# Patient Record
Sex: Male | Born: 1940 | Race: White | Hispanic: No | Marital: Married | Smoking: Never smoker
Health system: Southern US, Community
[De-identification: ages and names within clinical notes are randomized; demographics above are authoritative.]

## PROBLEM LIST (undated history)

## (undated) DIAGNOSIS — J309 Allergic rhinitis, unspecified: Secondary | ICD-10-CM

## (undated) DIAGNOSIS — R0683 Snoring: Secondary | ICD-10-CM

## (undated) DIAGNOSIS — E119 Type 2 diabetes mellitus without complications: Secondary | ICD-10-CM

## (undated) DIAGNOSIS — Z973 Presence of spectacles and contact lenses: Secondary | ICD-10-CM

## (undated) HISTORY — PX: TONSILLECTOMY: SUR1361

## (undated) HISTORY — DX: Allergic rhinitis, unspecified: J30.9

## (undated) HISTORY — PX: INCISIONAL HERNIA REPAIR: SHX193

## (undated) HISTORY — PX: COLONOSCOPY: SHX174

---

## 2000-03-07 ENCOUNTER — Ambulatory Visit: Admission: RE | Admit: 2000-03-07 | Discharge: 2000-03-07 | Payer: Self-pay

## 2012-02-07 DIAGNOSIS — M25549 Pain in joints of unspecified hand: Secondary | ICD-10-CM | POA: Diagnosis not present

## 2012-02-07 DIAGNOSIS — Z23 Encounter for immunization: Secondary | ICD-10-CM | POA: Diagnosis not present

## 2012-05-15 DIAGNOSIS — E119 Type 2 diabetes mellitus without complications: Secondary | ICD-10-CM | POA: Diagnosis not present

## 2012-05-15 DIAGNOSIS — H52209 Unspecified astigmatism, unspecified eye: Secondary | ICD-10-CM | POA: Diagnosis not present

## 2012-05-15 DIAGNOSIS — H40019 Open angle with borderline findings, low risk, unspecified eye: Secondary | ICD-10-CM | POA: Diagnosis not present

## 2012-06-18 DIAGNOSIS — E119 Type 2 diabetes mellitus without complications: Secondary | ICD-10-CM | POA: Diagnosis not present

## 2012-06-18 DIAGNOSIS — Z79899 Other long term (current) drug therapy: Secondary | ICD-10-CM | POA: Diagnosis not present

## 2012-06-18 DIAGNOSIS — E7800 Pure hypercholesterolemia, unspecified: Secondary | ICD-10-CM | POA: Diagnosis not present

## 2012-08-20 DIAGNOSIS — Z23 Encounter for immunization: Secondary | ICD-10-CM | POA: Diagnosis not present

## 2012-09-01 DIAGNOSIS — J069 Acute upper respiratory infection, unspecified: Secondary | ICD-10-CM | POA: Diagnosis not present

## 2012-09-01 DIAGNOSIS — D485 Neoplasm of uncertain behavior of skin: Secondary | ICD-10-CM | POA: Diagnosis not present

## 2013-01-11 DIAGNOSIS — D485 Neoplasm of uncertain behavior of skin: Secondary | ICD-10-CM | POA: Diagnosis not present

## 2013-01-11 DIAGNOSIS — L0100 Impetigo, unspecified: Secondary | ICD-10-CM | POA: Diagnosis not present

## 2013-01-20 DIAGNOSIS — D492 Neoplasm of unspecified behavior of bone, soft tissue, and skin: Secondary | ICD-10-CM | POA: Diagnosis not present

## 2013-01-20 DIAGNOSIS — M19049 Primary osteoarthritis, unspecified hand: Secondary | ICD-10-CM | POA: Diagnosis not present

## 2013-01-22 ENCOUNTER — Other Ambulatory Visit: Payer: Self-pay | Admitting: Orthopedic Surgery

## 2013-02-11 ENCOUNTER — Encounter (HOSPITAL_BASED_OUTPATIENT_CLINIC_OR_DEPARTMENT_OTHER): Payer: Self-pay | Admitting: *Deleted

## 2013-02-11 NOTE — Progress Notes (Signed)
To come in for [id number]  

## 2013-02-17 ENCOUNTER — Encounter (HOSPITAL_BASED_OUTPATIENT_CLINIC_OR_DEPARTMENT_OTHER)
Admission: RE | Admit: 2013-02-17 | Discharge: 2013-02-17 | Disposition: A | Discharge status: Home / Self Care | Payer: Medicare Other | Source: Ambulatory Visit | Attending: Orthopedic Surgery | Admitting: Orthopedic Surgery

## 2013-02-17 DIAGNOSIS — M898X9 Other specified disorders of bone, unspecified site: Secondary | ICD-10-CM | POA: Diagnosis not present

## 2013-02-17 DIAGNOSIS — E119 Type 2 diabetes mellitus without complications: Secondary | ICD-10-CM | POA: Diagnosis not present

## 2013-02-17 DIAGNOSIS — M19049 Primary osteoarthritis, unspecified hand: Secondary | ICD-10-CM | POA: Diagnosis not present

## 2013-02-17 DIAGNOSIS — M259 Joint disorder, unspecified: Secondary | ICD-10-CM | POA: Diagnosis not present

## 2013-02-17 LAB — BASIC METABOLIC PANEL
Chloride: 102 mEq/L (ref 96–112)
GFR calc Af Amer: 85 mL/min — ABNORMAL LOW (ref >90)
Potassium: 4.9 mEq/L (ref 3.5–5.1)
Sodium: 138 mEq/L (ref 135–145)

## 2013-02-18 ENCOUNTER — Encounter (HOSPITAL_BASED_OUTPATIENT_CLINIC_OR_DEPARTMENT_OTHER): Payer: Self-pay | Admitting: *Deleted

## 2013-02-18 ENCOUNTER — Ambulatory Visit (HOSPITAL_BASED_OUTPATIENT_CLINIC_OR_DEPARTMENT_OTHER)
Admission: RE | Admit: 2013-02-18 | Discharge: 2013-02-18 | Disposition: A | Discharge status: Home / Self Care | Payer: Medicare Other | Source: Ambulatory Visit | Attending: Orthopedic Surgery | Admitting: Orthopedic Surgery

## 2013-02-18 ENCOUNTER — Ambulatory Visit (HOSPITAL_BASED_OUTPATIENT_CLINIC_OR_DEPARTMENT_OTHER): Payer: Medicare Other | Admitting: Certified Registered"

## 2013-02-18 ENCOUNTER — Encounter (HOSPITAL_BASED_OUTPATIENT_CLINIC_OR_DEPARTMENT_OTHER)
Admission: RE | Disposition: A | Discharge status: Home / Self Care | Payer: Self-pay | Source: Ambulatory Visit | Attending: Orthopedic Surgery

## 2013-02-18 ENCOUNTER — Encounter (HOSPITAL_BASED_OUTPATIENT_CLINIC_OR_DEPARTMENT_OTHER): Payer: Self-pay | Admitting: Certified Registered"

## 2013-02-18 DIAGNOSIS — M19049 Primary osteoarthritis, unspecified hand: Secondary | ICD-10-CM | POA: Insufficient documentation

## 2013-02-18 DIAGNOSIS — M898X9 Other specified disorders of bone, unspecified site: Secondary | ICD-10-CM | POA: Diagnosis not present

## 2013-02-18 DIAGNOSIS — E119 Type 2 diabetes mellitus without complications: Secondary | ICD-10-CM | POA: Insufficient documentation

## 2013-02-18 DIAGNOSIS — M259 Joint disorder, unspecified: Secondary | ICD-10-CM | POA: Diagnosis not present

## 2013-02-18 DIAGNOSIS — D492 Neoplasm of unspecified behavior of bone, soft tissue, and skin: Secondary | ICD-10-CM | POA: Diagnosis not present

## 2013-02-18 DIAGNOSIS — M6740 Ganglion, unspecified site: Secondary | ICD-10-CM | POA: Diagnosis not present

## 2013-02-18 HISTORY — DX: Type 2 diabetes mellitus without complications: E11.9

## 2013-02-18 HISTORY — DX: Snoring: R06.83

## 2013-02-18 HISTORY — DX: Presence of spectacles and contact lenses: Z97.3

## 2013-02-18 LAB — GLUCOSE, CAPILLARY
Glucose-Capillary: 168 mg/dL — ABNORMAL HIGH (ref 70–99)
Glucose-Capillary: 142 mg/dL — ABNORMAL HIGH (ref 70–99)

## 2013-02-18 LAB — POCT HEMOGLOBIN-HEMACUE: Hemoglobin: 15.8 g/dL (ref 13.0–17.0)

## 2013-02-18 SURGERY — EXCISION METACARPAL MASS
Anesthesia: General | Site: Finger | Laterality: Left | Wound class: Clean

## 2013-02-18 MED ORDER — FENTANYL CITRATE 0.05 MG/ML IJ SOLN
INTRAMUSCULAR | Status: DC | PRN
  Administered 2013-02-18: 100 ug via INTRAVENOUS

## 2013-02-18 MED ORDER — PROPOFOL 10 MG/ML IV BOLUS
INTRAVENOUS | Status: DC | PRN
  Administered 2013-02-18: 200 mg via INTRAVENOUS

## 2013-02-18 MED ORDER — MIDAZOLAM HCL 5 MG/5ML IJ SOLN
INTRAMUSCULAR | Status: DC | PRN
  Administered 2013-02-18: 1 mg via INTRAVENOUS

## 2013-02-18 MED ORDER — ONDANSETRON HCL 4 MG/2ML IJ SOLN
INTRAMUSCULAR | Status: DC | PRN
  Administered 2013-02-18: 4 mg via INTRAVENOUS

## 2013-02-18 MED ORDER — CHLORHEXIDINE GLUCONATE 4 % EX LIQD: 60.0000 mL | Freq: Once | CUTANEOUS | Status: DC

## 2013-02-18 MED ORDER — EPHEDRINE SULFATE 50 MG/ML IJ SOLN
INTRAMUSCULAR | Status: DC | PRN
  Administered 2013-02-18: 10 mg via INTRAVENOUS

## 2013-02-18 MED ORDER — CEFAZOLIN SODIUM-DEXTROSE 2-3 GM-% IV SOLR
2.0000 g | INTRAVENOUS | Status: AC
  Administered 2013-02-18: 2 g via INTRAVENOUS

## 2013-02-18 MED ORDER — LIDOCAINE HCL (PF) 1 % IJ SOLN
INTRAMUSCULAR | Status: DC | PRN
  Administered 2013-02-18: .5 mL

## 2013-02-18 MED ORDER — METHYLPREDNISOLONE ACETATE 40 MG/ML IJ SUSP
INTRAMUSCULAR | Status: DC | PRN
  Administered 2013-02-18: .5 mL via INTRA_ARTICULAR

## 2013-02-18 MED ORDER — BUPIVACAINE HCL (PF) 0.25 % IJ SOLN
INTRAMUSCULAR | Status: DC | PRN
  Administered 2013-02-18: 10 mL

## 2013-02-18 MED ORDER — DEXAMETHASONE SODIUM PHOSPHATE 10 MG/ML IJ SOLN: INTRAMUSCULAR | Status: DC | PRN

## 2013-02-18 MED ORDER — HYDROCODONE-ACETAMINOPHEN 5-325 MG PO TABS: ORAL_TABLET | ORAL | Status: DC

## 2013-02-18 MED ORDER — FENTANYL CITRATE 0.05 MG/ML IJ SOLN: 50.0000 ug | INTRAMUSCULAR | Status: DC | PRN

## 2013-02-18 MED ORDER — LIDOCAINE HCL (CARDIAC) 20 MG/ML IV SOLN
INTRAVENOUS | Status: DC | PRN
  Administered 2013-02-18: 80 mg via INTRAVENOUS

## 2013-02-18 MED ORDER — LACTATED RINGERS IV SOLN
INTRAVENOUS | Status: DC
Start: 2013-02-18 — End: 2013-02-18
  Administered 2013-02-18: 08:00:00 via INTRAVENOUS

## 2013-02-18 MED ORDER — DEXAMETHASONE SODIUM PHOSPHATE 10 MG/ML IJ SOLN
INTRAMUSCULAR | Status: DC | PRN
  Administered 2013-02-18: 4 mg via INTRAVENOUS

## 2013-02-18 MED ORDER — MIDAZOLAM HCL 2 MG/2ML IJ SOLN: 1.0000 mg | INTRAMUSCULAR | Status: DC | PRN

## 2013-02-18 SURGICAL SUPPLY — 56 items
APL SKNCLS STERI-STRIP NONHPOA (GAUZE/BANDAGES/DRESSINGS)
BANDAGE COBAN STERILE 2 (GAUZE/BANDAGES/DRESSINGS) IMPLANT
BANDAGE CONFORM 2  STR LF (GAUZE/BANDAGES/DRESSINGS) IMPLANT
BANDAGE ELASTIC 3 VELCRO ST LF (GAUZE/BANDAGES/DRESSINGS) IMPLANT
BANDAGE GAUZE ELAST BULKY 4 IN (GAUZE/BANDAGES/DRESSINGS) IMPLANT
BANDAGE GAUZE STRT 1 STR LF (GAUZE/BANDAGES/DRESSINGS) IMPLANT
BENZOIN TINCTURE PRP APPL 2/3 (GAUZE/BANDAGES/DRESSINGS) IMPLANT
BLADE MINI RND TIP GREEN BEAV (BLADE) ×1 IMPLANT
BLADE SURG 15 STRL LF DISP TIS (BLADE) ×2 IMPLANT
BLADE SURG 15 STRL SS (BLADE) ×4
BNDG CMPR 9X4 STRL LF SNTH (GAUZE/BANDAGES/DRESSINGS) ×1
BNDG CMPR MD 5X2 ELC HKLP STRL (GAUZE/BANDAGES/DRESSINGS)
BNDG COHESIVE 1X5 TAN STRL LF (GAUZE/BANDAGES/DRESSINGS) ×1 IMPLANT
BNDG ELASTIC 2 VLCR STRL LF (GAUZE/BANDAGES/DRESSINGS) IMPLANT
BNDG ESMARK 4X9 LF (GAUZE/BANDAGES/DRESSINGS) ×1 IMPLANT
BNDG PLASTER X FAST 3X3 WHT LF (CAST SUPPLIES) IMPLANT
BNDG PLSTR 9X3 FST ST WHT (CAST SUPPLIES)
CHLORAPREP W/TINT 26ML (MISCELLANEOUS) ×2 IMPLANT
CLOTH BEACON ORANGE TIMEOUT ST (SAFETY) ×2 IMPLANT
CORDS BIPOLAR (ELECTRODE) ×2 IMPLANT
COVER MAYO STAND STRL (DRAPES) ×2 IMPLANT
COVER TABLE BACK 60X90 (DRAPES) ×2 IMPLANT
CUFF TOURNIQUET SINGLE 18IN (TOURNIQUET CUFF) ×2 IMPLANT
DRAPE EXTREMITY T 121X128X90 (DRAPE) ×1 IMPLANT
DRAPE SURG 17X23 STRL (DRAPES) ×2 IMPLANT
GAUZE XEROFORM 1X8 LF (GAUZE/BANDAGES/DRESSINGS) ×2 IMPLANT
GLOVE BIO SURGEON STRL SZ7.5 (GLOVE) ×2 IMPLANT
GLOVE BIOGEL PI IND STRL 7.5 (GLOVE) IMPLANT
GLOVE BIOGEL PI IND STRL 8 (GLOVE) ×1 IMPLANT
GLOVE BIOGEL PI INDICATOR 7.5 (GLOVE)
GLOVE BIOGEL PI INDICATOR 8 (GLOVE) ×1
GLOVE EXAM NITRILE EXT CUFF MD (GLOVE) ×1 IMPLANT
GLOVE SURG SS PI 7.5 STRL IVOR (GLOVE) IMPLANT
GOWN BRE IMP PREV XXLGXLNG (GOWN DISPOSABLE) ×2 IMPLANT
GOWN PREVENTION PLUS XLARGE (GOWN DISPOSABLE) ×2 IMPLANT
NDL HYPO 25X1 1.5 SAFETY (NEEDLE) ×1 IMPLANT
NEEDLE HYPO 25X1 1.5 SAFETY (NEEDLE) ×4 IMPLANT
NS IRRIG 1000ML POUR BTL (IV SOLUTION) ×2 IMPLANT
PACK BASIN DAY SURGERY FS (CUSTOM PROCEDURE TRAY) ×2 IMPLANT
PAD CAST 3X4 CTTN HI CHSV (CAST SUPPLIES) IMPLANT
PAD CAST 4YDX4 CTTN HI CHSV (CAST SUPPLIES) IMPLANT
PADDING CAST ABS 4INX4YD NS (CAST SUPPLIES) ×1
PADDING CAST ABS COTTON 4X4 ST (CAST SUPPLIES) ×1 IMPLANT
PADDING CAST COTTON 3X4 STRL (CAST SUPPLIES)
PADDING CAST COTTON 4X4 STRL (CAST SUPPLIES)
SPLINT FINGER 5/8X2.25 (CAST SUPPLIES) IMPLANT
SPLINT PLASTALUME 2 1/4 (CAST SUPPLIES) ×2
SPONGE GAUZE 4X4 12PLY (GAUZE/BANDAGES/DRESSINGS) ×2 IMPLANT
STOCKINETTE 4X48 STRL (DRAPES) ×2 IMPLANT
STRIP CLOSURE SKIN 1/2X4 (GAUZE/BANDAGES/DRESSINGS) IMPLANT
SUT ETHILON 3 0 PS 1 (SUTURE) IMPLANT
SUT ETHILON 4 0 PS 2 18 (SUTURE) ×3 IMPLANT
SYR BULB 3OZ (MISCELLANEOUS) ×2 IMPLANT
SYR CONTROL 10ML LL (SYRINGE) ×2 IMPLANT
TOWEL OR 17X24 6PK STRL BLUE (TOWEL DISPOSABLE) ×4 IMPLANT
UNDERPAD 30X30 INCONTINENT (UNDERPADS AND DIAPERS) ×2 IMPLANT

## 2013-02-18 NOTE — Op Note (Signed)
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  PROCEDURE:  Right CMC joint injection and left long finger excision of mucoid cyst and debridement, DIP joint.  SURGEON:  Betha Loa, MD.  ASSISTANT:  None.  ANESTHESIA:  General.  IV FLUIDS:  Per anesthesia flow sheet.  ESTIMATED BLOOD LOSS:  Minimal.  COMPLICATIONS:  None.  SPECIMENS:  Left long finger mucoid cyst to pathology.  TOURNIQUET TIME:  23 minutes.  DISPOSITION:  Stable to PACU.  INDICATIONS:  Mr. Rosenboom is a 72 year old male who has noted a 1-year history of mass on his left long finger.  This occasionally breaks open and leaks clear fluid out from underneath the cuticle.  There was no deformity.  I discussed with Mr. Pilley the nature of the condition.  We discussed nonoperative and operative treatment options.  He wished to proceed with operative excision of mucoid cyst and debridement of the DIP joint.  He also is to have an injection of the [facility] joint for arthritic pain.  Risks, benefits, and alternatives of surgery were discussed including the risk of blood loss; infection; damage to nerves, vessels, tendons, ligaments, bone; failure of surgery; need for additional surgery; complications with wound healing; continued pain; recurrence of the mass.  He voiced understanding and wished and elected to proceed.  OPERATIVE COURSE:  After being identified preoperatively by myself, the patient and I agreed upon procedure and site of procedure.   Surgical site was marked.  The risks, benefits, and alternatives of surgery were reviewed and he wished to proceed.  Surgical consent had been signed. He was given IV Ancef as preoperative antibiotic prophylaxis.  Patient was transferred to the operative room and placed on operating table in supine position with left upper extremity on arm board.  General anesthesia was induced by anesthesiologist.  Surgical pause was performed between surgeons, anesthesia, operating staff, and all were in agreement as to the patient, procedure, and site of procedure.  The right [facility] joint was injected under sterile conditions with Betadine alcohol prep with a solution of 0.5 mL of 1% plain lidocaine and 0.5 mL of Depo-Medrol.  This was dressed with a Band-Aid.  The left upper extremity was addressed.  It was prepped and draped in normal sterile orthopedic fashion.  Surgical pause had been performed.  Tourniquet at the proximal aspect of the extremity was inflated to 250 mmHg after exsanguination of the limb with an Esmarch bandage.  A hockey-stick incision was made at the DIP joint level of the long finger.  This was carried into subcutaneous tissues by spreading technique.  The mass was identified and freed up  of surrounding soft tissue attachments.  It was removed and sent to Pathology for examination.  The DIP joint was entered.  It was debrided of osteophytes and the synovium.  The wound was copiously irrigated with sterile saline.  It was closed with 4-0 nylon in a horizontal mattress fashion.  A digital block was performed with 10 mL of 0.25% plain Marcaine to aid in postoperative analgesia. The wound was dressed with sterile Xeroform, 4 x 4, and wrapped with a Coban dressing lightly.  An AlumaFoam splint was placed and wrapped lightly with a Coban dressing.  Tourniquet was deflated at 23 minutes. Fingertips were pink with brisk capillary refill after deflation of tourniquet.  Operative drapes were  broken down.  The patient was awoken from anesthesia safely.  He was transferred back to stretcher and taken to PACU in stable condition.  I will see him back in the office in 1 week for postoperative followup.  I will give him Norco 5/325 one to two p.o. q.6 h. p.r.n. pain, dispensed #40.     Betha Loa, MD     KK/MEDQ  D:  02/18/2013  T:  02/18/2013  Job:  [id number]

## 2013-02-18 NOTE — Op Note (Signed)
[id number] 

## 2013-02-18 NOTE — Anesthesia Procedure Notes (Signed)
Procedure Name: LMA Insertion Date/Time: 02/18/2013 8:46 AM Performed by: Lance Coon Pre-anesthesia Checklist: Patient identified, Emergency Drugs available, Suction available and Patient being monitored Patient Re-evaluated:Patient Re-evaluated prior to inductionOxygen Delivery Method: Circle System Utilized Preoxygenation: Pre-oxygenation with 100% oxygen Intubation Type: IV induction Ventilation: Mask ventilation without difficulty LMA: LMA inserted LMA Size: 5.0 Number of attempts: 1 Airway Equipment and Method: bite block Placement Confirmation: positive ETCO2 Tube secured with: Tape Dental Injury: Teeth and Oropharynx as per pre-operative assessment

## 2013-02-18 NOTE — Brief Op Note (Signed)
02/18/2013  9:25 AM  PATIENT:  Aaron May  72 y.o. male  PRE-OPERATIVE DIAGNOSIS:  LEFT LONG FINGR MUCOID CYST & DIP ARTHROSIS  POST-OPERATIVE DIAGNOSIS:  Left Long Finger Mucoid Cyst and Distal Interphalangeal Arthrosis  PROCEDURE:  Procedure(s): LEFT LONG FINGER EXCISION MASS & DIP DEBRIDEMENT, Injection Right Carpometacarpal Joint (Left)  SURGEON:  Surgeon(s) and Role:    * Tami Ribas, MD - Primary  PHYSICIAN ASSISTANT:   ASSISTANTS: none   ANESTHESIA:   general  EBL:     BLOOD ADMINISTERED:none  DRAINS: none   LOCAL MEDICATIONS USED:  MARCAINE     SPECIMEN:  Source of Specimen:  left long finger mucoid cyst  DISPOSITION OF SPECIMEN:  PATHOLOGY  COUNTS:  YES  TOURNIQUET:   Total Tourniquet Time Documented: Upper Arm (Left) - 23 minutes Total: Upper Arm (Left) - 23 minutes   DICTATION: .Other Dictation: Dictation Number [id number]  PLAN OF CARE: Discharge to home after PACU  PATIENT DISPOSITION:  PACU - hemodynamically stable.

## 2013-02-18 NOTE — Transfer of Care (Signed)
Immediate Anesthesia Transfer of Care Note  Patient: Aaron May  Procedure(s) Performed: Procedure(s): LEFT LONG FINGER EXCISION MASS & DIP DEBRIDEMENT, Injection Right Carpometacarpal Joint (Left)  Patient Location: PACU  Anesthesia Type:General  Level of Consciousness: awake  Airway & Oxygen Therapy: Patient Spontanous Breathing and Patient connected to face mask oxygen  Post-op Assessment: Report given to PACU RN and Post -op Vital signs reviewed and stable  Post vital signs: Reviewed and stable  Complications: No apparent anesthesia complications

## 2013-02-18 NOTE — Addendum Note (Signed)
Addendum created 02/18/13 1018 by Lance Coon, CRNA   Modules edited: Anesthesia Blocks and Procedures

## 2013-02-18 NOTE — H&P (Signed)
Aaron May is an 72 y.o. male.   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  Has had attempts at removal with cauterization with recurrence.  Occasionally will break and leak fluid under the cuticle. Also with CMC arthritis on right side.  Past Medical History  Diagnosis Date  . Diabetes mellitus without complication   . Wears glasses   . Snores     Past Surgical History  Procedure Laterality Date  . Tonsillectomy    . Colonoscopy    . Incisional hernia repair      right and left    History reviewed. No pertinent family history. Social History:  reports that he has never smoked. He does not have any smokeless tobacco history on file. He reports that  drinks alcohol. He reports that he does not use illicit drugs.  Allergies: No Known Allergies  Medications Prior to Admission  Medication Sig Dispense Refill  . aspirin 81 MG tablet Take 81 mg by mouth daily.      . pioglitazone-metformin (ACTOPLUS MET) 15-500 MG per tablet Take 1 tablet by mouth every morning.        Results for orders placed during the hospital encounter of 02/18/13 (from the past 48 hour(s))  BASIC METABOLIC PANEL     Status: Abnormal   Collection Time    02/17/13  8:20 AM      Result Value Range   Sodium 138  135 - 145 mEq/L   Potassium 4.9  3.5 - 5.1 mEq/L   Chloride 102  96 - 112 mEq/L   CO2 31  19 - 32 mEq/L   Glucose, Bld 246 (*) 70 - 99 mg/dL   BUN 17  6 - 23 mg/dL   Creatinine, Ser 2.53  0.50 - 1.35 mg/dL   Calcium 9.4  8.4 - 66.4 mg/dL   GFR calc non Af Amer 73 (*) >90 mL/min   GFR calc Af Amer 85 (*) >90 mL/min   Comment:            The eGFR has been calculated     using the CKD EPI equation.     This calculation has not been     validated in all clinical     situations.     eGFR's persistently     <90 mL/min signify     possible Chronic Kidney Disease.  GLUCOSE, CAPILLARY     Status: Abnormal   Collection Time    02/18/13  7:34 AM      Result Value Range   Glucose-Capillary 168 (*) 70 - 99 mg/dL  POCT HEMOGLOBIN-HEMACUE     Status: None   Collection Time    02/18/13  7:35 AM      Result Value Range   Hemoglobin 15.8  13.0 - 17.0 g/dL    No results found.   A comprehensive review of systems was negative except for: Eyes: positive for contacts/glasses  Blood pressure 157/86, pulse 60, temperature 97.6 F (36.4 C), temperature source Oral, resp. rate 20, height 6\' 1"  (1.854 m), weight 83.915 kg (185 lb), SpO2 98.00%.  General appearance: alert, cooperative and appears stated age Head: Normocephalic, without obvious abnormality, atraumatic Neck: supple, symmetrical, trachea midline Resp: clear to auscultation bilaterally Cardio: regular rate and rhythm GI: non tender Extremities: intact sensation and capillary refill all digits.  +epl/fpl/io.  mass distal to dip left long finger. skin intact.  ttp right cmc with positive grind test. Pulses: 2+ and symmetric Skin: Skin color, texture, turgor normal. No rashes or lesions Neurologic: Grossly normal Incision/Wound: na  Assessment/Plan Left long finger mucoid cyst, right cmc oa.  Non operative and operative treatment options were discussed with the patient and patient wishes to proceed with operative treatment. Risks, benefits, and alternatives of surgery were discussed and the patient agrees with the plan of care. Plan excision cyst and debridement dip joint left long finger and injection right cmc joint.  Kamariah Fruchter R 02/18/2013, 8:31 AM

## 2013-02-18 NOTE — Anesthesia Preprocedure Evaluation (Signed)
Anesthesia Evaluation  Patient identified by MRN, date of birth, ID band Patient awake    Reviewed: Allergy & Precautions, H&P , NPO status , Patient's Chart, lab work & pertinent test results  Airway Mallampati: I TM Distance: >3 FB Neck ROM: Full    Dental  (+) Teeth Intact and Dental Advisory Given   Pulmonary  breath sounds clear to auscultation        Cardiovascular Rhythm:Regular Rate:Normal     Neuro/Psych    GI/Hepatic   Endo/Other  diabetes, Well Controlled, Type 2  Renal/GU      Musculoskeletal   Abdominal   Peds  Hematology   Anesthesia Other Findings   Reproductive/Obstetrics                           Anesthesia Physical Anesthesia Plan  ASA: II  Anesthesia Plan: General   Post-op Pain Management:    Induction: Intravenous  Airway Management Planned:   Additional Equipment:   Intra-op Plan:   Post-operative Plan:   Informed Consent: I have reviewed the patients History and Physical, chart, labs and discussed the procedure including the risks, benefits and alternatives for the proposed anesthesia with the patient or authorized representative who has indicated his/her understanding and acceptance.   Dental advisory given  Plan Discussed with: CRNA, Anesthesiologist and Surgeon  Anesthesia Plan Comments:         Anesthesia Quick Evaluation

## 2013-02-18 NOTE — Anesthesia Postprocedure Evaluation (Signed)
  Anesthesia Post-op Note  Patient: Aaron May  Procedure(s) Performed: Procedure(s): LEFT LONG FINGER EXCISION MASS & DIP DEBRIDEMENT, Injection Right Carpometacarpal Joint (Left)  Patient Location: PACU  Anesthesia Type:General  Level of Consciousness: awake, alert  and oriented  Airway and Oxygen Therapy: Patient Spontanous Breathing  Post-op Pain: mild  Post-op Assessment: Post-op Vital signs reviewed  Post-op Vital Signs: Reviewed  Complications: No apparent anesthesia complications

## 2013-04-15 DIAGNOSIS — H52209 Unspecified astigmatism, unspecified eye: Secondary | ICD-10-CM | POA: Diagnosis not present

## 2013-04-15 DIAGNOSIS — E119 Type 2 diabetes mellitus without complications: Secondary | ICD-10-CM | POA: Diagnosis not present

## 2013-06-17 DIAGNOSIS — E7800 Pure hypercholesterolemia, unspecified: Secondary | ICD-10-CM | POA: Diagnosis not present

## 2013-06-17 DIAGNOSIS — Z79899 Other long term (current) drug therapy: Secondary | ICD-10-CM | POA: Diagnosis not present

## 2013-06-17 DIAGNOSIS — E119 Type 2 diabetes mellitus without complications: Secondary | ICD-10-CM | POA: Diagnosis not present

## 2013-06-17 DIAGNOSIS — Z125 Encounter for screening for malignant neoplasm of prostate: Secondary | ICD-10-CM | POA: Diagnosis not present

## 2013-06-17 DIAGNOSIS — Z0000 Encounter for general adult medical examination without abnormal findings: Secondary | ICD-10-CM | POA: Diagnosis not present

## 2013-07-24 DIAGNOSIS — Z23 Encounter for immunization: Secondary | ICD-10-CM | POA: Diagnosis not present

## 2014-01-08 DIAGNOSIS — J209 Acute bronchitis, unspecified: Secondary | ICD-10-CM | POA: Diagnosis not present

## 2014-04-22 DIAGNOSIS — H52209 Unspecified astigmatism, unspecified eye: Secondary | ICD-10-CM | POA: Diagnosis not present

## 2014-04-22 DIAGNOSIS — E119 Type 2 diabetes mellitus without complications: Secondary | ICD-10-CM | POA: Diagnosis not present

## 2014-04-22 DIAGNOSIS — H2510 Age-related nuclear cataract, unspecified eye: Secondary | ICD-10-CM | POA: Diagnosis not present

## 2014-06-23 DIAGNOSIS — E119 Type 2 diabetes mellitus without complications: Secondary | ICD-10-CM | POA: Diagnosis not present

## 2014-06-23 DIAGNOSIS — Z79899 Other long term (current) drug therapy: Secondary | ICD-10-CM | POA: Diagnosis not present

## 2014-06-23 DIAGNOSIS — E7800 Pure hypercholesterolemia, unspecified: Secondary | ICD-10-CM | POA: Diagnosis not present

## 2014-06-23 DIAGNOSIS — Z125 Encounter for screening for malignant neoplasm of prostate: Secondary | ICD-10-CM | POA: Diagnosis not present

## 2014-06-30 DIAGNOSIS — IMO0001 Reserved for inherently not codable concepts without codable children: Secondary | ICD-10-CM | POA: Diagnosis not present

## 2014-06-30 DIAGNOSIS — Z0000 Encounter for general adult medical examination without abnormal findings: Secondary | ICD-10-CM | POA: Diagnosis not present

## 2014-06-30 DIAGNOSIS — M75100 Unspecified rotator cuff tear or rupture of unspecified shoulder, not specified as traumatic: Secondary | ICD-10-CM | POA: Diagnosis not present

## 2014-06-30 DIAGNOSIS — Z23 Encounter for immunization: Secondary | ICD-10-CM | POA: Diagnosis not present

## 2014-06-30 DIAGNOSIS — IMO0002 Reserved for concepts with insufficient information to code with codable children: Secondary | ICD-10-CM | POA: Diagnosis not present

## 2014-06-30 DIAGNOSIS — E7800 Pure hypercholesterolemia, unspecified: Secondary | ICD-10-CM | POA: Diagnosis not present

## 2014-07-15 DIAGNOSIS — M75100 Unspecified rotator cuff tear or rupture of unspecified shoulder, not specified as traumatic: Secondary | ICD-10-CM | POA: Diagnosis not present

## 2014-07-15 DIAGNOSIS — IMO0002 Reserved for concepts with insufficient information to code with codable children: Secondary | ICD-10-CM | POA: Diagnosis not present

## 2014-10-06 DIAGNOSIS — M25511 Pain in right shoulder: Secondary | ICD-10-CM | POA: Diagnosis not present

## 2014-10-07 DIAGNOSIS — M25511 Pain in right shoulder: Secondary | ICD-10-CM | POA: Diagnosis not present

## 2014-10-17 DIAGNOSIS — J209 Acute bronchitis, unspecified: Secondary | ICD-10-CM | POA: Diagnosis not present

## 2014-11-03 DIAGNOSIS — M19011 Primary osteoarthritis, right shoulder: Secondary | ICD-10-CM | POA: Diagnosis not present

## 2014-11-03 DIAGNOSIS — S43431D Superior glenoid labrum lesion of right shoulder, subsequent encounter: Secondary | ICD-10-CM | POA: Diagnosis not present

## 2014-11-03 DIAGNOSIS — M25511 Pain in right shoulder: Secondary | ICD-10-CM | POA: Diagnosis not present

## 2014-11-28 DIAGNOSIS — G8918 Other acute postprocedural pain: Secondary | ICD-10-CM | POA: Diagnosis not present

## 2014-11-28 DIAGNOSIS — S43431A Superior glenoid labrum lesion of right shoulder, initial encounter: Secondary | ICD-10-CM | POA: Diagnosis not present

## 2014-11-28 DIAGNOSIS — M19011 Primary osteoarthritis, right shoulder: Secondary | ICD-10-CM | POA: Diagnosis not present

## 2014-11-28 DIAGNOSIS — M24111 Other articular cartilage disorders, right shoulder: Secondary | ICD-10-CM | POA: Diagnosis not present

## 2014-11-28 DIAGNOSIS — M75111 Incomplete rotator cuff tear or rupture of right shoulder, not specified as traumatic: Secondary | ICD-10-CM | POA: Diagnosis not present

## 2014-11-28 DIAGNOSIS — M94211 Chondromalacia, right shoulder: Secondary | ICD-10-CM | POA: Diagnosis not present

## 2014-11-28 DIAGNOSIS — M66821 Spontaneous rupture of other tendons, right upper arm: Secondary | ICD-10-CM | POA: Diagnosis not present

## 2014-11-28 DIAGNOSIS — M1990 Unspecified osteoarthritis, unspecified site: Secondary | ICD-10-CM | POA: Diagnosis not present

## 2014-12-01 DIAGNOSIS — M25511 Pain in right shoulder: Secondary | ICD-10-CM | POA: Diagnosis not present

## 2014-12-06 DIAGNOSIS — M19011 Primary osteoarthritis, right shoulder: Secondary | ICD-10-CM | POA: Diagnosis not present

## 2014-12-06 DIAGNOSIS — S43431D Superior glenoid labrum lesion of right shoulder, subsequent encounter: Secondary | ICD-10-CM | POA: Diagnosis not present

## 2014-12-06 DIAGNOSIS — M25511 Pain in right shoulder: Secondary | ICD-10-CM | POA: Diagnosis not present

## 2014-12-15 DIAGNOSIS — M25511 Pain in right shoulder: Secondary | ICD-10-CM | POA: Diagnosis not present

## 2014-12-22 DIAGNOSIS — M25511 Pain in right shoulder: Secondary | ICD-10-CM | POA: Diagnosis not present

## 2015-01-03 DIAGNOSIS — M25511 Pain in right shoulder: Secondary | ICD-10-CM | POA: Diagnosis not present

## 2015-01-05 DIAGNOSIS — Z4789 Encounter for other orthopedic aftercare: Secondary | ICD-10-CM | POA: Diagnosis not present

## 2015-01-05 DIAGNOSIS — S43431D Superior glenoid labrum lesion of right shoulder, subsequent encounter: Secondary | ICD-10-CM | POA: Diagnosis not present

## 2015-01-05 DIAGNOSIS — M75121 Complete rotator cuff tear or rupture of right shoulder, not specified as traumatic: Secondary | ICD-10-CM | POA: Diagnosis not present

## 2015-01-13 DIAGNOSIS — M25511 Pain in right shoulder: Secondary | ICD-10-CM | POA: Diagnosis not present

## 2015-01-23 DIAGNOSIS — H1132 Conjunctival hemorrhage, left eye: Secondary | ICD-10-CM | POA: Diagnosis not present

## 2015-01-23 DIAGNOSIS — H5712 Ocular pain, left eye: Secondary | ICD-10-CM | POA: Diagnosis not present

## 2015-01-23 DIAGNOSIS — M25511 Pain in right shoulder: Secondary | ICD-10-CM | POA: Diagnosis not present

## 2015-01-27 DIAGNOSIS — M25511 Pain in right shoulder: Secondary | ICD-10-CM | POA: Diagnosis not present

## 2015-01-30 DIAGNOSIS — M25511 Pain in right shoulder: Secondary | ICD-10-CM | POA: Diagnosis not present

## 2015-02-02 DIAGNOSIS — S43431D Superior glenoid labrum lesion of right shoulder, subsequent encounter: Secondary | ICD-10-CM | POA: Diagnosis not present

## 2015-02-02 DIAGNOSIS — Z4789 Encounter for other orthopedic aftercare: Secondary | ICD-10-CM | POA: Diagnosis not present

## 2015-02-13 DIAGNOSIS — M25511 Pain in right shoulder: Secondary | ICD-10-CM | POA: Diagnosis not present

## 2015-02-20 DIAGNOSIS — Z79899 Other long term (current) drug therapy: Secondary | ICD-10-CM | POA: Diagnosis not present

## 2015-02-20 DIAGNOSIS — E780 Pure hypercholesterolemia: Secondary | ICD-10-CM | POA: Diagnosis not present

## 2015-02-21 DIAGNOSIS — E1169 Type 2 diabetes mellitus with other specified complication: Secondary | ICD-10-CM | POA: Diagnosis not present

## 2015-02-21 DIAGNOSIS — E1165 Type 2 diabetes mellitus with hyperglycemia: Secondary | ICD-10-CM | POA: Diagnosis not present

## 2015-02-21 DIAGNOSIS — M25511 Pain in right shoulder: Secondary | ICD-10-CM | POA: Diagnosis not present

## 2015-02-21 DIAGNOSIS — E780 Pure hypercholesterolemia: Secondary | ICD-10-CM | POA: Diagnosis not present

## 2015-02-21 DIAGNOSIS — Z1389 Encounter for screening for other disorder: Secondary | ICD-10-CM | POA: Diagnosis not present

## 2015-02-24 DIAGNOSIS — M25511 Pain in right shoulder: Secondary | ICD-10-CM | POA: Diagnosis not present

## 2015-02-28 DIAGNOSIS — M25511 Pain in right shoulder: Secondary | ICD-10-CM | POA: Diagnosis not present

## 2015-03-02 DIAGNOSIS — Z4789 Encounter for other orthopedic aftercare: Secondary | ICD-10-CM | POA: Diagnosis not present

## 2015-03-02 DIAGNOSIS — S43431D Superior glenoid labrum lesion of right shoulder, subsequent encounter: Secondary | ICD-10-CM | POA: Diagnosis not present

## 2015-03-10 DIAGNOSIS — M25511 Pain in right shoulder: Secondary | ICD-10-CM | POA: Diagnosis not present

## 2015-03-17 DIAGNOSIS — M25511 Pain in right shoulder: Secondary | ICD-10-CM | POA: Diagnosis not present

## 2015-03-24 DIAGNOSIS — E780 Pure hypercholesterolemia: Secondary | ICD-10-CM | POA: Diagnosis not present

## 2015-03-24 DIAGNOSIS — E119 Type 2 diabetes mellitus without complications: Secondary | ICD-10-CM | POA: Diagnosis not present

## 2015-03-24 DIAGNOSIS — E1165 Type 2 diabetes mellitus with hyperglycemia: Secondary | ICD-10-CM | POA: Diagnosis not present

## 2015-03-24 DIAGNOSIS — E1169 Type 2 diabetes mellitus with other specified complication: Secondary | ICD-10-CM | POA: Diagnosis not present

## 2015-03-31 DIAGNOSIS — M25511 Pain in right shoulder: Secondary | ICD-10-CM | POA: Diagnosis not present

## 2015-04-07 DIAGNOSIS — M25511 Pain in right shoulder: Secondary | ICD-10-CM | POA: Diagnosis not present

## 2015-04-13 DIAGNOSIS — S43431D Superior glenoid labrum lesion of right shoulder, subsequent encounter: Secondary | ICD-10-CM | POA: Diagnosis not present

## 2015-04-13 DIAGNOSIS — Z4789 Encounter for other orthopedic aftercare: Secondary | ICD-10-CM | POA: Diagnosis not present

## 2015-04-14 DIAGNOSIS — M25511 Pain in right shoulder: Secondary | ICD-10-CM | POA: Diagnosis not present

## 2015-04-24 DIAGNOSIS — E780 Pure hypercholesterolemia: Secondary | ICD-10-CM | POA: Diagnosis not present

## 2015-04-24 DIAGNOSIS — H52203 Unspecified astigmatism, bilateral: Secondary | ICD-10-CM | POA: Diagnosis not present

## 2015-04-24 DIAGNOSIS — E11329 Type 2 diabetes mellitus with mild nonproliferative diabetic retinopathy without macular edema: Secondary | ICD-10-CM | POA: Diagnosis not present

## 2015-04-24 DIAGNOSIS — E119 Type 2 diabetes mellitus without complications: Secondary | ICD-10-CM | POA: Diagnosis not present

## 2015-05-24 DIAGNOSIS — E119 Type 2 diabetes mellitus without complications: Secondary | ICD-10-CM | POA: Diagnosis not present

## 2015-05-24 DIAGNOSIS — E780 Pure hypercholesterolemia: Secondary | ICD-10-CM | POA: Diagnosis not present

## 2015-05-27 DIAGNOSIS — S60512A Abrasion of left hand, initial encounter: Secondary | ICD-10-CM | POA: Diagnosis not present

## 2015-06-24 DIAGNOSIS — E119 Type 2 diabetes mellitus without complications: Secondary | ICD-10-CM | POA: Diagnosis not present

## 2015-06-24 DIAGNOSIS — E782 Mixed hyperlipidemia: Secondary | ICD-10-CM | POA: Diagnosis not present

## 2015-07-11 DIAGNOSIS — E1169 Type 2 diabetes mellitus with other specified complication: Secondary | ICD-10-CM | POA: Diagnosis not present

## 2015-07-11 DIAGNOSIS — Z125 Encounter for screening for malignant neoplasm of prostate: Secondary | ICD-10-CM | POA: Diagnosis not present

## 2015-07-11 DIAGNOSIS — Z0000 Encounter for general adult medical examination without abnormal findings: Secondary | ICD-10-CM | POA: Diagnosis not present

## 2015-07-11 DIAGNOSIS — Z23 Encounter for immunization: Secondary | ICD-10-CM | POA: Diagnosis not present

## 2015-07-11 DIAGNOSIS — E780 Pure hypercholesterolemia: Secondary | ICD-10-CM | POA: Diagnosis not present

## 2015-07-11 DIAGNOSIS — E1165 Type 2 diabetes mellitus with hyperglycemia: Secondary | ICD-10-CM | POA: Diagnosis not present

## 2015-09-24 DIAGNOSIS — E785 Hyperlipidemia, unspecified: Secondary | ICD-10-CM | POA: Diagnosis not present

## 2015-09-24 DIAGNOSIS — E119 Type 2 diabetes mellitus without complications: Secondary | ICD-10-CM | POA: Diagnosis not present

## 2015-10-24 DIAGNOSIS — E119 Type 2 diabetes mellitus without complications: Secondary | ICD-10-CM | POA: Diagnosis not present

## 2015-10-24 DIAGNOSIS — E785 Hyperlipidemia, unspecified: Secondary | ICD-10-CM | POA: Diagnosis not present

## 2015-10-27 DIAGNOSIS — S93412A Sprain of calcaneofibular ligament of left ankle, initial encounter: Secondary | ICD-10-CM | POA: Diagnosis not present

## 2015-11-17 DIAGNOSIS — K5730 Diverticulosis of large intestine without perforation or abscess without bleeding: Secondary | ICD-10-CM | POA: Diagnosis not present

## 2015-11-17 DIAGNOSIS — Z1211 Encounter for screening for malignant neoplasm of colon: Secondary | ICD-10-CM | POA: Diagnosis not present

## 2015-11-29 ENCOUNTER — Ambulatory Visit (INDEPENDENT_AMBULATORY_CARE_PROVIDER_SITE_OTHER): Payer: Medicare Other | Admitting: Family Medicine

## 2015-11-29 ENCOUNTER — Encounter: Payer: Self-pay | Admitting: Family Medicine

## 2015-11-29 VITALS — BP 128/88 | HR 73 | Ht 73.0 in | Wt 176.0 lb

## 2015-11-29 DIAGNOSIS — S46812A Strain of other muscles, fascia and tendons at shoulder and upper arm level, left arm, initial encounter: Secondary | ICD-10-CM

## 2015-11-29 NOTE — Patient Instructions (Signed)
You have trapezius strain/spasms. Start physical therapy and do home exercises on days you don't go to therapy. Heat for spasms 15 minutes at a time 3-4 times a day. Tylenol or aleve as needed for pain. Follow up with me in 5-6 weeks for reevaluation.

## 2015-12-04 DIAGNOSIS — S46819A Strain of other muscles, fascia and tendons at shoulder and upper arm level, unspecified arm, initial encounter: Secondary | ICD-10-CM | POA: Insufficient documentation

## 2015-12-04 NOTE — Assessment & Plan Note (Signed)
brief MSK u/s without evidence tear or abnormalities in area of pain other the increased thickness of trapezius.  Start physical therapy and home exercise program.  Heat, tylenol/aleve as needed.  F/u in 5-6 weeks for reevaluation.

## 2015-12-04 NOTE — Progress Notes (Signed)
PCP: No primary care provider on file.  Subjective:   HPI: Patient is a 75 y.o. male here for left shoulder pain.  Patient reports having about 3 years of posterior left shoulder/neck pain. No acute injury. Has times where this is very severe. Pain level 3/10, dull. Worse with turning to left side. Tried advil. Felt more when lifting weights. No skin changes, fever, other complaints.  Past Medical History  Diagnosis Date  . Diabetes mellitus without complication ([place])   . Wears glasses   . Snores     Current Outpatient Prescriptions on File Prior to Visit  Medication Sig Dispense Refill  . aspirin 81 MG tablet Take 81 mg by mouth daily.    Marland Kitchen HYDROcodone-acetaminophen (NORCO) 5-325 MG per tablet 1-2 tabs po q6 hours prn pain 40 tablet 0  . pioglitazone-metformin (ACTOPLUS MET) 15-500 MG per tablet Take 1 tablet by mouth every morning.     No current facility-administered medications on file prior to visit.    Past Surgical History  Procedure Laterality Date  . Tonsillectomy    . Colonoscopy    . Incisional hernia repair      right and left    No Known Allergies  Social History   Social History  . Marital Status: Married    Spouse Name: N/A  . Number of Children: N/A  . Years of Education: N/A   Occupational History  . Not on file.   Social History Main Topics  . Smoking status: Never Smoker   . Smokeless tobacco: Not on file  . Alcohol Use: 0.0 oz/week    0 Standard drinks or equivalent per week     Comment: very rare  . Drug Use: No  . Sexual Activity: Not on file   Other Topics Concern  . Not on file   Social History Narrative    No family history on file.  BP 128/88 mmHg  Pulse 73  Ht '6\' 1"'$  (1.854 m)  Wt 176 lb (79.833 kg)  BMI 23.23 kg/m2  Review of Systems: See HPI above.    Objective:  Physical Exam:  Gen: NAD  Neck: No gross deformity, swelling, bruising. TTP left trapezius with spasm.  No midline/bony TTP. FROM neck - pain  on left lateral rotation. BUE strength 5/5.   Sensation intact to light touch.   Negative spurlings. NV intact distal BUEs.  Left shoulder: No swelling, ecchymoses.  No gross deformity. No TTP. FROM. Negative Hawkins, Neers. Negative Speeds, Yergasons. Strength 5/5 with empty can and resisted internal/external rotation. Negative apprehension. NV intact distally.    Assessment & Plan:  1. Left trapezius strain/spasms - brief MSK u/s without evidence tear or abnormalities in area of pain other the increased thickness of trapezius.  Start physical therapy and home exercise program.  Heat, tylenol/aleve as needed.  F/u in 5-6 weeks for reevaluation.

## 2015-12-11 DIAGNOSIS — S46012D Strain of muscle(s) and tendon(s) of the rotator cuff of left shoulder, subsequent encounter: Secondary | ICD-10-CM | POA: Diagnosis not present

## 2015-12-11 DIAGNOSIS — M25512 Pain in left shoulder: Secondary | ICD-10-CM | POA: Diagnosis not present

## 2015-12-11 DIAGNOSIS — M6281 Muscle weakness (generalized): Secondary | ICD-10-CM | POA: Diagnosis not present

## 2015-12-14 DIAGNOSIS — M25512 Pain in left shoulder: Secondary | ICD-10-CM | POA: Diagnosis not present

## 2015-12-14 DIAGNOSIS — S46012D Strain of muscle(s) and tendon(s) of the rotator cuff of left shoulder, subsequent encounter: Secondary | ICD-10-CM | POA: Diagnosis not present

## 2015-12-14 DIAGNOSIS — M6281 Muscle weakness (generalized): Secondary | ICD-10-CM | POA: Diagnosis not present

## 2015-12-19 DIAGNOSIS — M6281 Muscle weakness (generalized): Secondary | ICD-10-CM | POA: Diagnosis not present

## 2015-12-19 DIAGNOSIS — S46012D Strain of muscle(s) and tendon(s) of the rotator cuff of left shoulder, subsequent encounter: Secondary | ICD-10-CM | POA: Diagnosis not present

## 2015-12-19 DIAGNOSIS — M25512 Pain in left shoulder: Secondary | ICD-10-CM | POA: Diagnosis not present

## 2015-12-21 DIAGNOSIS — M25512 Pain in left shoulder: Secondary | ICD-10-CM | POA: Diagnosis not present

## 2015-12-21 DIAGNOSIS — S46012D Strain of muscle(s) and tendon(s) of the rotator cuff of left shoulder, subsequent encounter: Secondary | ICD-10-CM | POA: Diagnosis not present

## 2015-12-21 DIAGNOSIS — M6281 Muscle weakness (generalized): Secondary | ICD-10-CM | POA: Diagnosis not present

## 2015-12-25 DIAGNOSIS — M25512 Pain in left shoulder: Secondary | ICD-10-CM | POA: Diagnosis not present

## 2015-12-25 DIAGNOSIS — S46012D Strain of muscle(s) and tendon(s) of the rotator cuff of left shoulder, subsequent encounter: Secondary | ICD-10-CM | POA: Diagnosis not present

## 2015-12-25 DIAGNOSIS — M6281 Muscle weakness (generalized): Secondary | ICD-10-CM | POA: Diagnosis not present

## 2015-12-26 ENCOUNTER — Ambulatory Visit (INDEPENDENT_AMBULATORY_CARE_PROVIDER_SITE_OTHER): Payer: Medicare Other | Admitting: Family Medicine

## 2015-12-26 ENCOUNTER — Encounter: Payer: Self-pay | Admitting: Family Medicine

## 2015-12-26 VITALS — BP 130/82 | HR 68 | Ht 73.0 in | Wt 176.0 lb

## 2015-12-26 DIAGNOSIS — M25561 Pain in right knee: Secondary | ICD-10-CM

## 2015-12-26 NOTE — Patient Instructions (Signed)
You have a popliteus strain. This should resolve over the next 2-4 weeks. Ice for 2 more days then switch to heat 15 minutes at a time 3-4 times a day. Ibuprofen or aleve for pain and inflammation for about 7 days then as needed. Consider compression sleeve or ace wrap. Do the stretch I showed you to ease the knee into full extension when you're standing - hold for 20-30 seconds repeat 10 times once or twice a day. Strengthening is similar - locking and unlocking your knees slowly for 3 sets of 10 once a day. Consider physical therapy if not improving. Swimming and cycling are great for this. Follow up with me in 1 month.

## 2015-12-27 DIAGNOSIS — S46012D Strain of muscle(s) and tendon(s) of the rotator cuff of left shoulder, subsequent encounter: Secondary | ICD-10-CM | POA: Diagnosis not present

## 2015-12-27 DIAGNOSIS — M6281 Muscle weakness (generalized): Secondary | ICD-10-CM | POA: Diagnosis not present

## 2015-12-27 DIAGNOSIS — M25512 Pain in left shoulder: Secondary | ICD-10-CM | POA: Diagnosis not present

## 2015-12-28 DIAGNOSIS — M25561 Pain in right knee: Secondary | ICD-10-CM | POA: Insufficient documentation

## 2015-12-28 NOTE — Assessment & Plan Note (Signed)
consistent with strain of popliteus muscle.  MSK u/s without evidence bakers cyst.  Icing, nsaids, compression sleeve.  Shown home exercises and stretches to do daily.  Consider physical therapy if not improving.  F/u in 1 month.  No prodromal symptoms before he passed out in shower though we discussed follow up with PCP.

## 2015-12-28 NOTE — Progress Notes (Signed)
PCP: No primary care provider on file.  Subjective:   HPI: Patient is a 75 y.o. male here for right knee pain.  Patient reports he was in the shower stall at the gym on 2/27 when he felt lightheaded and passed out (slid downwards and hyperflexed right knee he believes. Denies chest pain, shortness of breath prior to this episode. Pain since then posterior aspect of right knee. Pain can radiate down the calf. Unable to lock the knee in extension without pain. Pain is sharp, 8/10 level. No skin changes, fever, other complaints.  Past Medical History  Diagnosis Date  . Diabetes mellitus without complication ([place])   . Wears glasses   . Snores     Current Outpatient Prescriptions on File Prior to Visit  Medication Sig Dispense Refill  . aspirin 81 MG tablet Take 81 mg by mouth daily.    Marland Kitchen HYDROcodone-acetaminophen (NORCO) 5-325 MG per tablet 1-2 tabs po q6 hours prn pain 40 tablet 0  . pioglitazone-metformin (ACTOPLUS MET) 15-500 MG per tablet Take 1 tablet by mouth every morning.     No current facility-administered medications on file prior to visit.    Past Surgical History  Procedure Laterality Date  . Tonsillectomy    . Colonoscopy    . Incisional hernia repair      right and left    No Known Allergies  Social History   Social History  . Marital Status: Married    Spouse Name: N/A  . Number of Children: N/A  . Years of Education: N/A   Occupational History  . Not on file.   Social History Main Topics  . Smoking status: Never Smoker   . Smokeless tobacco: Not on file  . Alcohol Use: 0.0 oz/week    0 Standard drinks or equivalent per week     Comment: very rare  . Drug Use: No  . Sexual Activity: Not on file   Other Topics Concern  . Not on file   Social History Narrative    No family history on file.  BP 130/82 mmHg  Pulse 68  Ht '6\' 1"'$  (1.854 m)  Wt 176 lb (79.833 kg)  BMI 23.23 kg/m2  Review of Systems: See HPI above.    Objective:   Physical Exam:  Gen: NAD, comfortable in exam room  Right knee: No gross deformity, ecchymoses, swelling. TTP popliteal fossa. FROM though pain on full extension. Negative ant/post drawers. Negative valgus/varus testing. Negative lachmanns. Negative mcmurrays, apleys, patellar apprehension. NV intact distally.  Left knee: FROM without pain.    Assessment & Plan:  1. Right knee pain - consistent with strain of popliteus muscle.  MSK u/s without evidence bakers cyst.  Icing, nsaids, compression sleeve.  Shown home exercises and stretches to do daily.  Consider physical therapy if not improving.  F/u in 1 month.  No prodromal symptoms before he passed out in shower though we discussed follow up with PCP.

## 2016-01-01 DIAGNOSIS — M25512 Pain in left shoulder: Secondary | ICD-10-CM | POA: Diagnosis not present

## 2016-01-01 DIAGNOSIS — S46012D Strain of muscle(s) and tendon(s) of the rotator cuff of left shoulder, subsequent encounter: Secondary | ICD-10-CM | POA: Diagnosis not present

## 2016-01-01 DIAGNOSIS — M6281 Muscle weakness (generalized): Secondary | ICD-10-CM | POA: Diagnosis not present

## 2016-01-03 ENCOUNTER — Ambulatory Visit (INDEPENDENT_AMBULATORY_CARE_PROVIDER_SITE_OTHER): Payer: Medicare Other | Admitting: Family Medicine

## 2016-01-03 ENCOUNTER — Encounter: Payer: Self-pay | Admitting: Family Medicine

## 2016-01-03 ENCOUNTER — Encounter (INDEPENDENT_AMBULATORY_CARE_PROVIDER_SITE_OTHER): Payer: Self-pay

## 2016-01-03 VITALS — BP 130/76 | HR 67 | Ht 73.0 in | Wt 176.0 lb

## 2016-01-03 DIAGNOSIS — S46812D Strain of other muscles, fascia and tendons at shoulder and upper arm level, left arm, subsequent encounter: Secondary | ICD-10-CM | POA: Diagnosis not present

## 2016-01-04 ENCOUNTER — Ambulatory Visit: Payer: Medicare Other | Admitting: Family Medicine

## 2016-01-04 DIAGNOSIS — S46012D Strain of muscle(s) and tendon(s) of the rotator cuff of left shoulder, subsequent encounter: Secondary | ICD-10-CM | POA: Diagnosis not present

## 2016-01-04 DIAGNOSIS — M25512 Pain in left shoulder: Secondary | ICD-10-CM | POA: Diagnosis not present

## 2016-01-04 DIAGNOSIS — M6281 Muscle weakness (generalized): Secondary | ICD-10-CM | POA: Diagnosis not present

## 2016-01-04 NOTE — Assessment & Plan Note (Signed)
brief MSK u/s last visit without evidence tear or abnormalities in area of pain other the increased thickness of trapezius.  Improving with physical therapy, home exercises.  Continue this and transition to home exercise program.  Heat, tylenol/aleve as needed.  F/u prn.

## 2016-01-04 NOTE — Progress Notes (Signed)
PCP: No primary care provider on file.  Subjective:   HPI: Patient is a 75 y.o. male here for left shoulder pain.  2/1: Patient reports having about 3 years of posterior left shoulder/neck pain. No acute injury. Has times where this is very severe. Pain level 3/10, dull. Worse with turning to left side. Tried advil. Felt more when lifting weights. No skin changes, fever, other complaints.  3/8: Patient did well with physical therapy. Improving with this. Pain can get up to a 4/10 but is less frequent. Motion better. Pain is more of a soreness. Last visits of PT next week. No skin changes, fever.  Past Medical History  Diagnosis Date  . Diabetes mellitus without complication ([place])   . Wears glasses   . Snores     Current Outpatient Prescriptions on File Prior to Visit  Medication Sig Dispense Refill  . aspirin 81 MG tablet Take 81 mg by mouth daily.    Marland Kitchen HYDROcodone-acetaminophen (NORCO) 5-325 MG per tablet 1-2 tabs po q6 hours prn pain 40 tablet 0  . pioglitazone-metformin (ACTOPLUS MET) 15-500 MG per tablet Take 1 tablet by mouth every morning.     No current facility-administered medications on file prior to visit.    Past Surgical History  Procedure Laterality Date  . Tonsillectomy    . Colonoscopy    . Incisional hernia repair      right and left    No Known Allergies  Social History   Social History  . Marital Status: Married    Spouse Name: N/A  . Number of Children: N/A  . Years of Education: N/A   Occupational History  . Not on file.   Social History Main Topics  . Smoking status: Never Smoker   . Smokeless tobacco: Not on file  . Alcohol Use: 0.0 oz/week    0 Standard drinks or equivalent per week     Comment: very rare  . Drug Use: No  . Sexual Activity: Not on file   Other Topics Concern  . Not on file   Social History Narrative    No family history on file.  BP 130/76 mmHg  Pulse 67  Ht '6\' 1"'$  (1.854 m)  Wt 176 lb (79.833  kg)  BMI 23.23 kg/m2  Review of Systems: See HPI above.    Objective:  Physical Exam:  Gen: NAD  Neck: No gross deformity, swelling, bruising. Minimal TTP left trapezius.  No midline/bony TTP. FROM neck - tightness on left lateral rotation. BUE strength 5/5.   Sensation intact to light touch.   Negative spurlings. NV intact distal BUEs.  Left shoulder: No swelling, ecchymoses.  No gross deformity. No TTP. FROM. Negative Hawkins, Neers. Negative Speeds, Yergasons. Strength 5/5 with empty can and resisted internal/external rotation. Negative apprehension. NV intact distally.    Assessment & Plan:  1. Left trapezius strain/spasms - brief MSK u/s last visit without evidence tear or abnormalities in area of pain other the increased thickness of trapezius.  Improving with physical therapy, home exercises.  Continue this and transition to home exercise program.  Heat, tylenol/aleve as needed.  F/u prn.

## 2016-01-11 DIAGNOSIS — M6281 Muscle weakness (generalized): Secondary | ICD-10-CM | POA: Diagnosis not present

## 2016-01-11 DIAGNOSIS — M25512 Pain in left shoulder: Secondary | ICD-10-CM | POA: Diagnosis not present

## 2016-01-11 DIAGNOSIS — S46012D Strain of muscle(s) and tendon(s) of the rotator cuff of left shoulder, subsequent encounter: Secondary | ICD-10-CM | POA: Diagnosis not present

## 2016-01-15 DIAGNOSIS — M6281 Muscle weakness (generalized): Secondary | ICD-10-CM | POA: Diagnosis not present

## 2016-01-15 DIAGNOSIS — M25512 Pain in left shoulder: Secondary | ICD-10-CM | POA: Diagnosis not present

## 2016-01-15 DIAGNOSIS — S46012D Strain of muscle(s) and tendon(s) of the rotator cuff of left shoulder, subsequent encounter: Secondary | ICD-10-CM | POA: Diagnosis not present

## 2016-01-17 ENCOUNTER — Telehealth: Payer: Self-pay | Admitting: Family Medicine

## 2016-01-17 NOTE — Telephone Encounter (Signed)
Finished, note not needed

## 2016-01-24 ENCOUNTER — Encounter: Payer: Self-pay | Admitting: Family Medicine

## 2016-01-24 ENCOUNTER — Ambulatory Visit (INDEPENDENT_AMBULATORY_CARE_PROVIDER_SITE_OTHER): Payer: Medicare Other | Admitting: Family Medicine

## 2016-01-24 VITALS — BP 127/68 | HR 72 | Ht 73.0 in | Wt 176.0 lb

## 2016-01-24 DIAGNOSIS — M25561 Pain in right knee: Secondary | ICD-10-CM

## 2016-01-25 DIAGNOSIS — M6281 Muscle weakness (generalized): Secondary | ICD-10-CM | POA: Diagnosis not present

## 2016-01-25 DIAGNOSIS — S46012D Strain of muscle(s) and tendon(s) of the rotator cuff of left shoulder, subsequent encounter: Secondary | ICD-10-CM | POA: Diagnosis not present

## 2016-01-25 DIAGNOSIS — M25512 Pain in left shoulder: Secondary | ICD-10-CM | POA: Diagnosis not present

## 2016-01-25 NOTE — Progress Notes (Signed)
PCP: No primary care provider on file.  Subjective:   HPI: Patient is a Aaron May here for right knee pain.  2/28: Patient reports he was in the shower stall at the gym on 2/27 when he felt lightheaded and passed out (slid downwards and hyperflexed right knee he believes. Denies chest pain, shortness of breath prior to this episode. Pain since then posterior aspect of right knee. Pain can radiate down the calf. Unable to lock the knee in extension without pain. Pain is sharp, 8/10 level. No skin changes, fever, other complaints.  3/29: Patient reports his knee has improved quite a bit. Pain at most 4/10 and dull. Some soreness primarily posteriorly. Can fully flex and extend comfortably now. No swelling. No skin changes, fever.  Past Medical History  Diagnosis Date  . Diabetes mellitus without complication (HCC)   . Wears glasses   . Snores     Current Outpatient Prescriptions on File Prior to Visit  Medication Sig Dispense Refill  . aspirin 81 MG tablet Take 81 mg by mouth daily.    . HYDROcodone-acetaminophen (NORCO) 5-325 MG per tablet 1-2 tabs po q6 hours prn pain 40 tablet 0  . pioglitazone-metformin (ACTOPLUS MET) 15-500 MG per tablet Take 1 tablet by mouth every morning.     No current facility-administered medications on file prior to visit.    Past Surgical History  Procedure Laterality Date  . Tonsillectomy    . Colonoscopy    . Incisional hernia repair      right and left    No Known Allergies  Social History   Social History  . Marital Status: Married    Spouse Name: N/A  . Number of Children: N/A  . Years of Education: N/A   Occupational History  . Not on file.   Social History Main Topics  . Smoking status: Never Smoker   . Smokeless tobacco: Not on file  . Alcohol Use: 0.0 oz/week    0 Standard drinks or equivalent per week     Comment: very rare  . Drug Use: No  . Sexual Activity: Not on file   Other Topics Concern  . Not on  file   Social History Narrative    No family history on file.  BP 127/68 mmHg  Pulse Aaron  Ht 6' 1" (1.854 m)  Wt 176 lb (79.833 kg)  BMI 23.23 kg/m2  Review of Systems: See HPI above.    Objective:  Physical Exam:  Gen: NAD, comfortable in exam room  Right knee: No gross deformity, ecchymoses, swelling. TTP popliteal fossa. FROM though pain on full extension. Negative ant/post drawers. Negative valgus/varus testing. Negative lachmanns. Negative mcmurrays, apleys, patellar apprehension. NV intact distally.  Left knee: FROM without pain.    Assessment & Plan:  1. Right knee pain - consistent with strain of popliteus muscle.  Improving clinically.  Continue with home exercises.  Consider physical therapy if not improving.   

## 2016-01-25 NOTE — Assessment & Plan Note (Signed)
consistent with strain of popliteus muscle.  Improving clinically.  Continue with home exercises.  Consider physical therapy if not improving.

## 2016-01-27 DIAGNOSIS — J0100 Acute maxillary sinusitis, unspecified: Secondary | ICD-10-CM | POA: Diagnosis not present

## 2016-02-06 DIAGNOSIS — M25512 Pain in left shoulder: Secondary | ICD-10-CM | POA: Diagnosis not present

## 2016-02-06 DIAGNOSIS — M6281 Muscle weakness (generalized): Secondary | ICD-10-CM | POA: Diagnosis not present

## 2016-02-06 DIAGNOSIS — S46012D Strain of muscle(s) and tendon(s) of the rotator cuff of left shoulder, subsequent encounter: Secondary | ICD-10-CM | POA: Diagnosis not present

## 2016-02-13 DIAGNOSIS — S46012D Strain of muscle(s) and tendon(s) of the rotator cuff of left shoulder, subsequent encounter: Secondary | ICD-10-CM | POA: Diagnosis not present

## 2016-02-13 DIAGNOSIS — M25512 Pain in left shoulder: Secondary | ICD-10-CM | POA: Diagnosis not present

## 2016-02-13 DIAGNOSIS — M6281 Muscle weakness (generalized): Secondary | ICD-10-CM | POA: Diagnosis not present

## 2016-03-11 DIAGNOSIS — M6281 Muscle weakness (generalized): Secondary | ICD-10-CM | POA: Diagnosis not present

## 2016-03-11 DIAGNOSIS — M25512 Pain in left shoulder: Secondary | ICD-10-CM | POA: Diagnosis not present

## 2016-03-11 DIAGNOSIS — S46012D Strain of muscle(s) and tendon(s) of the rotator cuff of left shoulder, subsequent encounter: Secondary | ICD-10-CM | POA: Diagnosis not present

## 2016-04-18 DIAGNOSIS — S99922A Unspecified injury of left foot, initial encounter: Secondary | ICD-10-CM | POA: Diagnosis not present

## 2016-04-19 DIAGNOSIS — L255 Unspecified contact dermatitis due to plants, except food: Secondary | ICD-10-CM | POA: Diagnosis not present

## 2016-04-26 DIAGNOSIS — H52203 Unspecified astigmatism, bilateral: Secondary | ICD-10-CM | POA: Diagnosis not present

## 2016-04-26 DIAGNOSIS — H2513 Age-related nuclear cataract, bilateral: Secondary | ICD-10-CM | POA: Diagnosis not present

## 2016-04-26 DIAGNOSIS — E119 Type 2 diabetes mellitus without complications: Secondary | ICD-10-CM | POA: Diagnosis not present

## 2016-07-09 DIAGNOSIS — J329 Chronic sinusitis, unspecified: Secondary | ICD-10-CM | POA: Diagnosis not present

## 2016-07-09 DIAGNOSIS — J40 Bronchitis, not specified as acute or chronic: Secondary | ICD-10-CM | POA: Diagnosis not present

## 2016-08-15 DIAGNOSIS — Z23 Encounter for immunization: Secondary | ICD-10-CM | POA: Diagnosis not present

## 2016-10-03 DIAGNOSIS — E1169 Type 2 diabetes mellitus with other specified complication: Secondary | ICD-10-CM | POA: Diagnosis not present

## 2016-10-03 DIAGNOSIS — Z0000 Encounter for general adult medical examination without abnormal findings: Secondary | ICD-10-CM | POA: Diagnosis not present

## 2016-10-03 DIAGNOSIS — Z79899 Other long term (current) drug therapy: Secondary | ICD-10-CM | POA: Diagnosis not present

## 2016-10-03 DIAGNOSIS — Z125 Encounter for screening for malignant neoplasm of prostate: Secondary | ICD-10-CM | POA: Diagnosis not present

## 2016-10-03 DIAGNOSIS — R5383 Other fatigue: Secondary | ICD-10-CM | POA: Diagnosis not present

## 2016-10-03 DIAGNOSIS — E7800 Pure hypercholesterolemia, unspecified: Secondary | ICD-10-CM | POA: Diagnosis not present

## 2016-10-03 DIAGNOSIS — E785 Hyperlipidemia, unspecified: Secondary | ICD-10-CM | POA: Diagnosis not present

## 2016-10-03 DIAGNOSIS — E119 Type 2 diabetes mellitus without complications: Secondary | ICD-10-CM | POA: Diagnosis not present

## 2016-12-10 DIAGNOSIS — Z23 Encounter for immunization: Secondary | ICD-10-CM | POA: Diagnosis not present

## 2017-01-14 DIAGNOSIS — M25511 Pain in right shoulder: Secondary | ICD-10-CM | POA: Diagnosis not present

## 2017-04-17 DIAGNOSIS — M19011 Primary osteoarthritis, right shoulder: Secondary | ICD-10-CM | POA: Diagnosis not present

## 2017-04-17 DIAGNOSIS — M25511 Pain in right shoulder: Secondary | ICD-10-CM | POA: Diagnosis not present

## 2017-04-28 DIAGNOSIS — H52203 Unspecified astigmatism, bilateral: Secondary | ICD-10-CM | POA: Diagnosis not present

## 2017-04-28 DIAGNOSIS — E119 Type 2 diabetes mellitus without complications: Secondary | ICD-10-CM | POA: Diagnosis not present

## 2017-04-28 DIAGNOSIS — H2513 Age-related nuclear cataract, bilateral: Secondary | ICD-10-CM | POA: Diagnosis not present

## 2017-05-09 DIAGNOSIS — Z6825 Body mass index (BMI) 25.0-25.9, adult: Secondary | ICD-10-CM | POA: Diagnosis not present

## 2017-05-09 DIAGNOSIS — M47816 Spondylosis without myelopathy or radiculopathy, lumbar region: Secondary | ICD-10-CM | POA: Diagnosis not present

## 2017-05-09 DIAGNOSIS — M25551 Pain in right hip: Secondary | ICD-10-CM | POA: Diagnosis not present

## 2017-05-19 ENCOUNTER — Encounter: Payer: Self-pay | Admitting: Family Medicine

## 2017-05-19 ENCOUNTER — Ambulatory Visit (INDEPENDENT_AMBULATORY_CARE_PROVIDER_SITE_OTHER): Payer: Medicare Other | Admitting: Family Medicine

## 2017-05-19 ENCOUNTER — Ambulatory Visit (HOSPITAL_BASED_OUTPATIENT_CLINIC_OR_DEPARTMENT_OTHER)
Admission: RE | Admit: 2017-05-19 | Discharge: 2017-05-19 | Disposition: A | Discharge status: Home / Self Care | Payer: Medicare Other | Source: Ambulatory Visit | Attending: Family Medicine | Admitting: Family Medicine

## 2017-05-19 VITALS — BP 164/71 | HR 66 | Ht 73.0 in | Wt 175.0 lb

## 2017-05-19 DIAGNOSIS — M25551 Pain in right hip: Secondary | ICD-10-CM | POA: Diagnosis not present

## 2017-05-19 NOTE — Patient Instructions (Addendum)
Your exam is reassuring today, minimal arthritis of your hip. These are the different medications you can take for this: Continue your tylenol. Consider Aleve 1-2 tabs twice a day with food Capsaicin, aspercreme, or biofreeze topically up to four times a day may also help with pain. Some supplements that may help for arthritis: Boswellia extract, curcumin, pycnogenol Cortisone injections are an option - I would avoid these unless pain becomes severe. It's important that you continue to stay active. Straight leg raises, standing hip rotations, side leg raises 3 sets of 10 once a day (add ankle weight if these become too easy). Stretches (pick 3 of these) - hold for 20-30 seconds, repeat 3 times Consider physical therapy to strengthen muscles around the joint that hurts to take pressure off of the joint itself. Shoe inserts with good arch support may be helpful. Heat or ice 15 minutes at a time 3-4 times a day as needed to help with pain. Water aerobics and cycling with low resistance are the best two types of exercise for arthritis but all activities are ok as tolerated (if not limping and pain not worse than 3 on a scale of 1-10). Follow up with me in 1 month to 6 weeks.

## 2017-05-21 DIAGNOSIS — M25551 Pain in right hip: Secondary | ICD-10-CM | POA: Insufficient documentation

## 2017-05-21 NOTE — Progress Notes (Signed)
PCP: Street, Sharon Mt, MD  Subjective:   HPI: Patient is a 76 y.o. male here for right hip pain.  Patient reports for about 15 years he's had lateral right hip pain. Seems to improve with walking, worse if he's immobile. Came on again about 2-3 weeks ago and hasn't gone away. No radiation. No numbness or tingling. No bowel/bladder dysfunction. He recently finished a prednisone dose pack which helped. Taking tylenol arthritis still. Pain level is 4/10, dull. No skin changes.  Past Medical History:  Diagnosis Date  . Diabetes mellitus without complication ([place])   . Snores   . Wears glasses     Current Outpatient Prescriptions on File Prior to Visit  Medication Sig Dispense Refill  . aspirin 81 MG tablet Take 81 mg by mouth daily.    Marland Kitchen HYDROcodone-acetaminophen (NORCO) 5-325 MG per tablet 1-2 tabs po q6 hours prn pain 40 tablet 0  . pioglitazone-metformin (ACTOPLUS MET) 15-500 MG per tablet Take 1 tablet by mouth every morning.     No current facility-administered medications on file prior to visit.     Past Surgical History:  Procedure Laterality Date  . COLONOSCOPY    . INCISIONAL HERNIA REPAIR     right and left  . TONSILLECTOMY      No Known Allergies  Social History   Social History  . Marital status: Married    Spouse name: N/A  . Number of children: N/A  . Years of education: N/A   Occupational History  . Not on file.   Social History Main Topics  . Smoking status: Never Smoker  . Smokeless tobacco: Never Used  . Alcohol use 0.0 oz/week     Comment: very rare  . Drug use: No  . Sexual activity: Not on file   Other Topics Concern  . Not on file   Social History Narrative  . No narrative on file    No family history on file.  BP (!) 164/71   Pulse 66   Ht _0  (1.854 m)   Wt 175 lb (79.4 kg)   BMI 23.09 kg/m   Review of Systems: See HPI above.     Objective:  Physical Exam:  Gen: NAD, comfortable in exam room  Back/right  hip: No gross deformity, scoliosis. No TTP .  No midline or bony TTP. FROM with minimal limitation IR hip. Strength LEs 5/5 all muscle groups.   2+ MSRs in patellar and achilles tendons, equal bilaterally. Negative SLRs. Sensation intact to light touch bilaterally. Negative logroll bilateral hips Negative fabers and piriformis stretches.  Assessment & Plan:  1. Right hip pain - patient's exam is very reassuring today, normal.  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  He just finished prednisone dose pack so question if this helped quite a bit and now exam is normal.  Shown home exercises to do daily for hip and his back.  Heat/ice.  Discussed tylenol, aleve, supplements, topical medications.  F/u in 1 month to 6 weeks.

## 2017-05-21 NOTE — Assessment & Plan Note (Signed)
patient's exam is very reassuring today, normal.  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  He just finished prednisone dose pack so question if this helped quite a bit and now exam is normal.  Shown home exercises to do daily for hip and his back.  Heat/ice.  Discussed tylenol, aleve, supplements, topical medications.  F/u in 1 month to 6 weeks.

## 2017-06-19 ENCOUNTER — Ambulatory Visit: Payer: Medicare Other | Admitting: Family Medicine

## 2017-06-23 ENCOUNTER — Encounter: Payer: Self-pay | Admitting: Family Medicine

## 2017-06-23 ENCOUNTER — Ambulatory Visit (INDEPENDENT_AMBULATORY_CARE_PROVIDER_SITE_OTHER): Payer: Medicare Other | Admitting: Family Medicine

## 2017-06-23 DIAGNOSIS — M25551 Pain in right hip: Secondary | ICD-10-CM

## 2017-06-23 NOTE — Patient Instructions (Signed)
Continue your home exercises regularly for 4 more weeks then back down to 2-3 times a week indefinitely. Call me if you have any problems or questions. Follow up with me as needed but let me know how you're doing in 6 weeks.

## 2017-06-24 NOTE — Progress Notes (Signed)
PCP: Street, Sharon Mt, MD  Subjective:   HPI: Patient is a 76 y.o. male here for right hip pain.  7/23: Patient reports for about 15 years he's had lateral right hip pain. Seems to improve with walking, worse if he's immobile. Came on again about 2-3 weeks ago and hasn't gone away. No radiation. No numbness or tingling. No bowel/bladder dysfunction. He recently finished a prednisone dose pack which helped. Taking tylenol arthritis still. Pain level is 4/10, dull. No skin changes.  8/27: Patient reports he's doing much better - pain at most 1/10 and lateral. Can barely tell pain is there in the morning now. Doing home exercises regularly. No numbness/tingling.  Past Medical History:  Diagnosis Date  . Diabetes mellitus without complication ([place])   . Snores   . Wears glasses     Current Outpatient Prescriptions on File Prior to Visit  Medication Sig Dispense Refill  . aspirin 81 MG tablet Take 81 mg by mouth daily.    Marland Kitchen HYDROcodone-acetaminophen (NORCO) 5-325 MG per tablet 1-2 tabs po q6 hours prn pain 40 tablet 0  . pioglitazone-metformin (ACTOPLUS MET) 15-500 MG per tablet Take 1 tablet by mouth every morning.     No current facility-administered medications on file prior to visit.     Past Surgical History:  Procedure Laterality Date  . COLONOSCOPY    . INCISIONAL HERNIA REPAIR     right and left  . TONSILLECTOMY      No Known Allergies  Social History   Social History  . Marital status: Married    Spouse name: N/A  . Number of children: N/A  . Years of education: N/A   Occupational History  . Not on file.   Social History Main Topics  . Smoking status: Never Smoker  . Smokeless tobacco: Never Used  . Alcohol use 0.0 oz/week     Comment: very rare  . Drug use: No  . Sexual activity: Not on file   Other Topics Concern  . Not on file   Social History Narrative  . No narrative on file    No family history on file.  BP 134/80   Pulse  60   Ht '6\' 1"'$  (1.854 m)   Wt 175 lb (79.4 kg)   BMI 23.09 kg/m   Review of Systems: See HPI above.     Objective:  Physical Exam:  Gen: NAD, comfortable in exam room  Back/right hip: No gross deformity, scoliosis. No TTP .  No midline or bony TTP. FROM with minimal limitation IR hip. Strength LEs 5/5 all muscle groups.   Negative SLRs. Sensation intact to light touch bilaterally. Negative logroll bilateral hips Negative fabers and piriformis stretches.  Assessment & Plan:  1. Right hip pain - Patient doing much better with home exercises and stretches - encouraged to continue these for 4 more weeks daily then back down to 2-3 times a week.  Heat/ice if needed.  Tylenol if needed.  F/u prn.

## 2017-06-24 NOTE — Assessment & Plan Note (Signed)
Patient doing much better with home exercises and stretches - encouraged to continue these for 4 more weeks daily then back down to 2-3 times a week.  Heat/ice if needed.  Tylenol if needed.  F/u prn.

## 2017-07-30 DIAGNOSIS — Z23 Encounter for immunization: Secondary | ICD-10-CM | POA: Diagnosis not present

## 2017-09-10 DIAGNOSIS — B9689 Other specified bacterial agents as the cause of diseases classified elsewhere: Secondary | ICD-10-CM | POA: Diagnosis not present

## 2017-09-10 DIAGNOSIS — J208 Acute bronchitis due to other specified organisms: Secondary | ICD-10-CM | POA: Diagnosis not present

## 2017-09-10 DIAGNOSIS — Z1339 Encounter for screening examination for other mental health and behavioral disorders: Secondary | ICD-10-CM | POA: Diagnosis not present

## 2017-11-17 ENCOUNTER — Encounter: Payer: Self-pay | Admitting: Podiatry

## 2017-11-17 ENCOUNTER — Ambulatory Visit (INDEPENDENT_AMBULATORY_CARE_PROVIDER_SITE_OTHER): Payer: Medicare Other | Admitting: Podiatry

## 2017-11-17 DIAGNOSIS — B351 Tinea unguium: Secondary | ICD-10-CM | POA: Diagnosis not present

## 2017-11-17 DIAGNOSIS — E1151 Type 2 diabetes mellitus with diabetic peripheral angiopathy without gangrene: Secondary | ICD-10-CM

## 2017-11-17 DIAGNOSIS — E119 Type 2 diabetes mellitus without complications: Secondary | ICD-10-CM

## 2017-11-17 MED ORDER — CICLOPIROX 8 % EX SOLN: Freq: Every day | CUTANEOUS | 0 refills | Status: DC

## 2017-11-17 NOTE — Progress Notes (Signed)
  Subjective:  Patient ID: Aaron May, male    DOB: 1941/10/26,  MRN: [id number]  Chief Complaint  Patient presents with  . Nail Problem    left 1st toe nail is cracked    77 y.o. male presents with the above complaint.  Endorses diabetes with last a.m. blood sugar 140; PCP Dr. Venetia Maxon.  Believes that his left great toenail is cracked states that 2 years ago he dropped a piece of wood on it is concerned that is the reason why the nail is cracked.  Very active as a Academic librarian.  Past Medical History:  Diagnosis Date  . Diabetes mellitus without complication ([place])   . Snores   . Wears glasses    Past Surgical History:  Procedure Laterality Date  . COLONOSCOPY    . INCISIONAL HERNIA REPAIR     right and left  . TONSILLECTOMY      Current Outpatient Medications:  .  aspirin 81 MG tablet, Take 81 mg by mouth daily., Disp: , Rfl:  .  ciclopirox (PENLAC) 8 % solution, Apply topically at bedtime. Apply over nail and surrounding skin. Apply daily over previous coat. Remove weekly with file or polish remover., Disp: 6.6 mL, Rfl: 0 .  HYDROcodone-acetaminophen (NORCO) 5-325 MG per tablet, 1-2 tabs po q6 hours prn pain, Disp: 40 tablet, Rfl: 0 .  pioglitazone-metformin (ACTOPLUS MET) 15-500 MG per tablet, Take 1 tablet by mouth every morning., Disp: , Rfl:   No Known Allergies Review of Systems Objective:  There were no vitals filed for this visit. General AA&O x3. Normal mood and affect.  Vascular Dorsalis pedis and posterior tibial pulses  present 1+ and absent bilaterally  Capillary refill delayed ~ 5 seconds to all digits R foot Pedal hair growth diminished.  Neurologic Epicritic sensation grossly present.  Dermatologic No open lesions. Interspaces clear of maceration. Bilateral great toenails with thickening, crumbly texture, yellow discoloration  Orthopedic: MMT 5/5 in dorsiflexion, plantarflexion, inversion, and eversion. Normal joint ROM without pain or crepitus.   Assessment  & Plan:  Patient was evaluated and treated and all questions answered.  Onychomycosis -Educated on etiology -Rx Penlac -L hallux nail debrided as below.  Procedure: Nail Debridement Rationale: Patient meets criteria for routine foot care due to Class B findings. Type of Debridement: manual, sharp debridement. Instrumentation: Nail nipper, rotary burr. Number of Nails: 1     DM with PAD -Will see every 6 months for DM check. Consider NIVS at next visit if symptomatic.Marland Kitchen  Return in about 6 weeks (around 12/29/2017) for Nail Fungus.

## 2017-11-20 DIAGNOSIS — E1169 Type 2 diabetes mellitus with other specified complication: Secondary | ICD-10-CM | POA: Diagnosis not present

## 2017-11-20 DIAGNOSIS — E785 Hyperlipidemia, unspecified: Secondary | ICD-10-CM | POA: Diagnosis not present

## 2017-11-20 DIAGNOSIS — E7800 Pure hypercholesterolemia, unspecified: Secondary | ICD-10-CM | POA: Diagnosis not present

## 2017-11-20 DIAGNOSIS — I872 Venous insufficiency (chronic) (peripheral): Secondary | ICD-10-CM | POA: Diagnosis not present

## 2017-11-20 DIAGNOSIS — Z79899 Other long term (current) drug therapy: Secondary | ICD-10-CM | POA: Diagnosis not present

## 2017-11-20 DIAGNOSIS — Z0000 Encounter for general adult medical examination without abnormal findings: Secondary | ICD-10-CM | POA: Diagnosis not present

## 2017-11-20 DIAGNOSIS — Z125 Encounter for screening for malignant neoplasm of prostate: Secondary | ICD-10-CM | POA: Diagnosis not present

## 2017-11-26 DIAGNOSIS — I872 Venous insufficiency (chronic) (peripheral): Secondary | ICD-10-CM | POA: Diagnosis not present

## 2017-11-26 DIAGNOSIS — R209 Unspecified disturbances of skin sensation: Secondary | ICD-10-CM | POA: Diagnosis not present

## 2017-11-27 DIAGNOSIS — I872 Venous insufficiency (chronic) (peripheral): Secondary | ICD-10-CM | POA: Diagnosis not present

## 2017-11-27 DIAGNOSIS — Z7982 Long term (current) use of aspirin: Secondary | ICD-10-CM | POA: Diagnosis not present

## 2017-11-27 DIAGNOSIS — E119 Type 2 diabetes mellitus without complications: Secondary | ICD-10-CM | POA: Diagnosis not present

## 2017-11-27 DIAGNOSIS — R209 Unspecified disturbances of skin sensation: Secondary | ICD-10-CM | POA: Diagnosis not present

## 2017-12-17 DIAGNOSIS — E1169 Type 2 diabetes mellitus with other specified complication: Secondary | ICD-10-CM | POA: Diagnosis not present

## 2017-12-17 DIAGNOSIS — E7800 Pure hypercholesterolemia, unspecified: Secondary | ICD-10-CM | POA: Diagnosis not present

## 2017-12-17 DIAGNOSIS — R208 Other disturbances of skin sensation: Secondary | ICD-10-CM | POA: Diagnosis not present

## 2017-12-17 DIAGNOSIS — Z6824 Body mass index (BMI) 24.0-24.9, adult: Secondary | ICD-10-CM | POA: Diagnosis not present

## 2018-03-02 DIAGNOSIS — E1169 Type 2 diabetes mellitus with other specified complication: Secondary | ICD-10-CM | POA: Diagnosis not present

## 2018-03-03 DIAGNOSIS — H0100A Unspecified blepharitis right eye, upper and lower eyelids: Secondary | ICD-10-CM | POA: Diagnosis not present

## 2018-03-03 DIAGNOSIS — H0100B Unspecified blepharitis left eye, upper and lower eyelids: Secondary | ICD-10-CM | POA: Diagnosis not present

## 2018-05-18 ENCOUNTER — Ambulatory Visit: Payer: Medicare Other | Admitting: Podiatry

## 2018-07-07 DIAGNOSIS — E113291 Type 2 diabetes mellitus with mild nonproliferative diabetic retinopathy without macular edema, right eye: Secondary | ICD-10-CM | POA: Diagnosis not present

## 2018-07-07 DIAGNOSIS — H2513 Age-related nuclear cataract, bilateral: Secondary | ICD-10-CM | POA: Diagnosis not present

## 2018-07-07 DIAGNOSIS — H52203 Unspecified astigmatism, bilateral: Secondary | ICD-10-CM | POA: Diagnosis not present

## 2018-07-27 ENCOUNTER — Encounter: Payer: Self-pay | Admitting: Family Medicine

## 2018-07-27 ENCOUNTER — Ambulatory Visit (INDEPENDENT_AMBULATORY_CARE_PROVIDER_SITE_OTHER): Payer: Medicare Other | Admitting: Family Medicine

## 2018-07-27 ENCOUNTER — Ambulatory Visit (HOSPITAL_BASED_OUTPATIENT_CLINIC_OR_DEPARTMENT_OTHER)
Admission: RE | Admit: 2018-07-27 | Discharge: 2018-07-27 | Disposition: A | Discharge status: Home / Self Care | Payer: Medicare Other | Source: Ambulatory Visit | Attending: Family Medicine | Admitting: Family Medicine

## 2018-07-27 VITALS — BP 122/83 | HR 71 | Ht 73.0 in | Wt 175.0 lb

## 2018-07-27 DIAGNOSIS — M25551 Pain in right hip: Secondary | ICD-10-CM | POA: Diagnosis not present

## 2018-07-27 DIAGNOSIS — S6992XA Unspecified injury of left wrist, hand and finger(s), initial encounter: Secondary | ICD-10-CM | POA: Insufficient documentation

## 2018-07-27 DIAGNOSIS — M7989 Other specified soft tissue disorders: Secondary | ICD-10-CM | POA: Insufficient documentation

## 2018-07-27 DIAGNOSIS — S6991XA Unspecified injury of right wrist, hand and finger(s), initial encounter: Secondary | ICD-10-CM

## 2018-07-27 DIAGNOSIS — M25531 Pain in right wrist: Secondary | ICD-10-CM | POA: Diagnosis not present

## 2018-07-27 DIAGNOSIS — M25532 Pain in left wrist: Secondary | ICD-10-CM | POA: Diagnosis not present

## 2018-07-27 NOTE — Progress Notes (Signed)
PCP: Street, Sharon Mt, MD  Subjective:   HPI: Patient is a 77 y.o. male here for bilateral hand pain and right hip pain.  4 days ago the patient states he tripped over a low fence around his garden.  He fell forward and caught himself on both of his hands stretched out in front of him.  He reports having pain over the thenar eminence of both hands with left worse than right.  It has been slowly improving but he continues to have some discomfort with grip strength and dull pain.  He reports some swelling initially but this is largely resolved.  He also notes extensive bruising of the hands bilaterally.  He denies any numbness or tingling. For the past 2 days he also reports intermittent 5/10 right hip pain.  He localizes pain around the anterior aspect of the hip.  He notes that it began after twisting suddenly to catch a ball.  It is minimally bothersome with walking.  He notices mostly if he sits with his leg crossed over his knee with the hip externally rotated and flexed.  He denies any numbness or tingling lower extremity  Past Medical History:  Diagnosis Date  . Diabetes mellitus without complication ([place])   . Snores   . Wears glasses     Current Outpatient Medications on File Prior to Visit  Medication Sig Dispense Refill  . aspirin 81 MG tablet Take 81 mg by mouth daily.    . ciclopirox (PENLAC) 8 % solution Apply topically at bedtime. Apply over nail and surrounding skin. Apply daily over previous coat. Remove weekly with file or polish remover. 6.6 mL 0  . HYDROcodone-acetaminophen (NORCO) 5-325 MG per tablet 1-2 tabs po q6 hours prn pain 40 tablet 0  . pioglitazone-metformin (ACTOPLUS MET) 15-500 MG per tablet Take 1 tablet by mouth every morning.     No current facility-administered medications on file prior to visit.     Past Surgical History:  Procedure Laterality Date  . COLONOSCOPY    . INCISIONAL HERNIA REPAIR     right and left  . TONSILLECTOMY      No Known  Allergies  Social History   Socioeconomic History  . Marital status: Married    Spouse name: Not on file  . Number of children: Not on file  . Years of education: Not on file  . Highest education level: Not on file  Occupational History  . Not on file  Social Needs  . Financial resource strain: Not on file  . Food insecurity:    Worry: Not on file    Inability: Not on file  . Transportation needs:    Medical: Not on file    Non-medical: Not on file  Tobacco Use  . Smoking status: Never Smoker  . Smokeless tobacco: Never Used  Substance and Sexual Activity  . Alcohol use: Yes    Alcohol/week: 0.0 standard drinks    Comment: very rare  . Drug use: No  . Sexual activity: Not on file  Lifestyle  . Physical activity:    Days per week: Not on file    Minutes per session: Not on file  . Stress: Not on file  Relationships  . Social connections:    Talks on phone: Not on file    Gets together: Not on file    Attends religious service: Not on file    Active member of club or organization: Not on file    Attends meetings of clubs  or organizations: Not on file    Relationship status: Not on file  . Intimate partner violence:    Fear of current or ex partner: Not on file    Emotionally abused: Not on file    Physically abused: Not on file    Forced sexual activity: Not on file  Other Topics Concern  . Not on file  Social History Narrative  . Not on file    No family history on file.  BP 122/83   Pulse 71   Ht '6\' 1"'$  (1.854 m)   Wt 175 lb (79.4 kg)   BMI 23.09 kg/m   Review of Systems: See HPI above.     Objective:  Physical Exam:  Gen: awake, alert, NAD, comfortable in exam room Pulm: breathing unlabored  Left hand: Inspection: No bony deformity.  There is bruising over the thenar eminence as well as the dorsal aspect of the thumb around the first metacarpal Palpation: There is tenderness at the anatomic snuffbox.  There is also tenderness on the volar  aspect of the first MCP ROM: Full ROM of the digits and wrist Strength: 5/5 strength in the forearm, wrist and interosseus muscles Neurovascular: NV intact  Right hand: Inspection: No bony deformity.  There is bruising over the thenar eminence as well as the dorsal aspect of the thumb around the first metacarpal Palpation: There is NO tenderness at the anatomic snuffbox.  There is mild tenderness on the volar aspect of the first MCP ROM: Full ROM of the digits and wrist Strength: 5/5 strength in the forearm, wrist and interosseus muscles Neurovascular: NV intact  Right hip:  - Inspection: No gross deformity, no swelling, erythema, or ecchymosis - Palpation: No focal tenderness.  There is mild tenderness in the area of the proximal sartorius and TFL.  There is no bony tenderness on exam - ROM: Normal range of motion on Flexion, extension, abduction, internal and external rotation - Strength: Normal strength. - Neuro/vasc: NV intact distally - Special Tests: Anterior pain with FABER.  Negative FADIR.     Assessment & Plan:  1.  Bilateral hand pain- the patient's pain bilaterally over the thenar eminence is likely soft tissue contusion.  X-rays were obtained of the bilateral wrist today to further evaluate for fracture.  There were independently reviewed today and show no acute fracture of the scaphoid.  There is some degenerative changes seen on the film most notably at the first [facility] joints bilaterally. - Ice 15 minutes 3-4 times daily - Ibuprofen, Aleve, or Tylenol as needed  2.  Right hip pain-this is most likely strain of his sartorius.  No concern for bony injury or intraarticular pathology as a cause for his pain - Home exercises as previously instructed - Tylenol or NSAIDs as needed - Follow-up in 4 weeks if no improvement.  Addendum:  Patient seen and examined with fellow.  Agree with his note and findings.

## 2018-07-27 NOTE — Patient Instructions (Signed)
You have wrist contusions. Icing 15 minutes at a time 3-4 times a day as needed. Tylenol 500mg  1-2 tabs three times a day as needed for pain. Ibuprofen 600mg  three times a day with food OR aleve 2 tabs twice a day with food for pain and inflammation. Can consider thumb spica wrist braces but these are not necessary and I'd expect you to improve over the next 2-3 weeks without these. Activities as tolerated without restrictions.  You strained your right sartorius muscle. Medications as noted above. When tolerated (ideally would wait until at least Thursday) can start standing hip rotations and the hacky sack exercise 3 sets of 10 once a day. Consider physical therapy if not improving. Follow up with me in 3-4 weeks if you're struggling otherwise follow up as needed.

## 2018-08-10 DIAGNOSIS — H02831 Dermatochalasis of right upper eyelid: Secondary | ICD-10-CM | POA: Diagnosis not present

## 2018-08-10 DIAGNOSIS — H02834 Dermatochalasis of left upper eyelid: Secondary | ICD-10-CM | POA: Diagnosis not present

## 2018-08-10 DIAGNOSIS — H11823 Conjunctivochalasis, bilateral: Secondary | ICD-10-CM | POA: Diagnosis not present

## 2018-08-19 DIAGNOSIS — Z23 Encounter for immunization: Secondary | ICD-10-CM | POA: Diagnosis not present

## 2018-08-24 ENCOUNTER — Ambulatory Visit: Payer: Medicare Other | Admitting: Family Medicine

## 2018-08-28 ENCOUNTER — Ambulatory Visit: Payer: Medicare Other | Admitting: Family Medicine

## 2018-09-14 DIAGNOSIS — H01002 Unspecified blepharitis right lower eyelid: Secondary | ICD-10-CM | POA: Diagnosis not present

## 2018-10-30 DIAGNOSIS — J069 Acute upper respiratory infection, unspecified: Secondary | ICD-10-CM | POA: Diagnosis not present

## 2019-01-05 DIAGNOSIS — E1169 Type 2 diabetes mellitus with other specified complication: Secondary | ICD-10-CM | POA: Diagnosis not present

## 2019-01-05 DIAGNOSIS — E113299 Type 2 diabetes mellitus with mild nonproliferative diabetic retinopathy without macular edema, unspecified eye: Secondary | ICD-10-CM | POA: Diagnosis not present

## 2019-01-05 DIAGNOSIS — E785 Hyperlipidemia, unspecified: Secondary | ICD-10-CM | POA: Diagnosis not present

## 2019-01-05 DIAGNOSIS — R208 Other disturbances of skin sensation: Secondary | ICD-10-CM | POA: Diagnosis not present

## 2019-01-05 DIAGNOSIS — Z0000 Encounter for general adult medical examination without abnormal findings: Secondary | ICD-10-CM | POA: Diagnosis not present

## 2019-02-01 DIAGNOSIS — E1169 Type 2 diabetes mellitus with other specified complication: Secondary | ICD-10-CM | POA: Diagnosis not present

## 2019-02-01 DIAGNOSIS — N390 Urinary tract infection, site not specified: Secondary | ICD-10-CM | POA: Diagnosis not present

## 2019-02-01 DIAGNOSIS — E7800 Pure hypercholesterolemia, unspecified: Secondary | ICD-10-CM | POA: Diagnosis not present

## 2019-02-01 DIAGNOSIS — Z87442 Personal history of urinary calculi: Secondary | ICD-10-CM | POA: Diagnosis not present

## 2019-02-02 DIAGNOSIS — Z79899 Other long term (current) drug therapy: Secondary | ICD-10-CM | POA: Diagnosis not present

## 2019-02-02 DIAGNOSIS — E1169 Type 2 diabetes mellitus with other specified complication: Secondary | ICD-10-CM | POA: Diagnosis not present

## 2019-02-02 DIAGNOSIS — E785 Hyperlipidemia, unspecified: Secondary | ICD-10-CM | POA: Diagnosis not present

## 2019-02-02 DIAGNOSIS — Z125 Encounter for screening for malignant neoplasm of prostate: Secondary | ICD-10-CM | POA: Diagnosis not present

## 2019-02-04 DIAGNOSIS — R109 Unspecified abdominal pain: Secondary | ICD-10-CM | POA: Diagnosis not present

## 2019-02-04 DIAGNOSIS — E119 Type 2 diabetes mellitus without complications: Secondary | ICD-10-CM | POA: Diagnosis not present

## 2019-02-04 DIAGNOSIS — N201 Calculus of ureter: Secondary | ICD-10-CM | POA: Diagnosis not present

## 2019-02-04 DIAGNOSIS — Z87442 Personal history of urinary calculi: Secondary | ICD-10-CM | POA: Diagnosis not present

## 2019-02-08 DIAGNOSIS — N201 Calculus of ureter: Secondary | ICD-10-CM | POA: Diagnosis not present

## 2019-02-08 DIAGNOSIS — N3000 Acute cystitis without hematuria: Secondary | ICD-10-CM | POA: Diagnosis not present

## 2019-02-15 DIAGNOSIS — N3020 Other chronic cystitis without hematuria: Secondary | ICD-10-CM | POA: Diagnosis not present

## 2019-02-15 DIAGNOSIS — N201 Calculus of ureter: Secondary | ICD-10-CM | POA: Diagnosis not present

## 2019-02-18 DIAGNOSIS — Z79899 Other long term (current) drug therapy: Secondary | ICD-10-CM | POA: Diagnosis not present

## 2019-02-18 DIAGNOSIS — Z79891 Long term (current) use of opiate analgesic: Secondary | ICD-10-CM | POA: Diagnosis not present

## 2019-02-18 DIAGNOSIS — J302 Other seasonal allergic rhinitis: Secondary | ICD-10-CM | POA: Diagnosis not present

## 2019-02-18 DIAGNOSIS — N202 Calculus of kidney with calculus of ureter: Secondary | ICD-10-CM | POA: Diagnosis not present

## 2019-02-18 DIAGNOSIS — N3020 Other chronic cystitis without hematuria: Secondary | ICD-10-CM | POA: Diagnosis not present

## 2019-02-18 DIAGNOSIS — E119 Type 2 diabetes mellitus without complications: Secondary | ICD-10-CM | POA: Diagnosis not present

## 2019-02-18 DIAGNOSIS — K219 Gastro-esophageal reflux disease without esophagitis: Secondary | ICD-10-CM | POA: Diagnosis not present

## 2019-02-18 DIAGNOSIS — Z7982 Long term (current) use of aspirin: Secondary | ICD-10-CM | POA: Diagnosis not present

## 2019-02-18 DIAGNOSIS — G4733 Obstructive sleep apnea (adult) (pediatric): Secondary | ICD-10-CM | POA: Diagnosis not present

## 2019-02-18 DIAGNOSIS — N201 Calculus of ureter: Secondary | ICD-10-CM | POA: Diagnosis not present

## 2019-02-25 DIAGNOSIS — N201 Calculus of ureter: Secondary | ICD-10-CM | POA: Diagnosis not present

## 2019-02-25 DIAGNOSIS — N3020 Other chronic cystitis without hematuria: Secondary | ICD-10-CM | POA: Diagnosis not present

## 2019-03-15 DIAGNOSIS — R351 Nocturia: Secondary | ICD-10-CM | POA: Diagnosis not present

## 2019-03-15 DIAGNOSIS — N3020 Other chronic cystitis without hematuria: Secondary | ICD-10-CM | POA: Diagnosis not present

## 2019-03-15 DIAGNOSIS — N201 Calculus of ureter: Secondary | ICD-10-CM | POA: Diagnosis not present

## 2019-04-13 DIAGNOSIS — S67191A Crushing injury of left index finger, initial encounter: Secondary | ICD-10-CM | POA: Diagnosis not present

## 2019-05-31 ENCOUNTER — Other Ambulatory Visit: Payer: Self-pay

## 2019-06-14 ENCOUNTER — Other Ambulatory Visit: Payer: Self-pay

## 2019-06-14 ENCOUNTER — Ambulatory Visit (INDEPENDENT_AMBULATORY_CARE_PROVIDER_SITE_OTHER): Payer: Medicare Other | Admitting: Podiatry

## 2019-06-14 DIAGNOSIS — B351 Tinea unguium: Secondary | ICD-10-CM

## 2019-07-11 NOTE — Progress Notes (Signed)
  Subjective:  Patient ID: Aaron May, male    DOB: 05/05/1941,  MRN: [id number]  No chief complaint on file.   78 y.o. male presents with the above complaint. States the left great toenail is lifting, loose from the nail bed. Present for 1 month. Reports 4/10 pain.    Review of Systems: Negative except as noted in the HPI. Denies N/V/F/Ch.  Past Medical History:  Diagnosis Date  . Diabetes mellitus without complication ([place])   . Snores   . Wears glasses     Current Outpatient Medications:  .  aspirin 81 MG tablet, Take 81 mg by mouth daily., Disp: , Rfl:  .  ciclopirox (PENLAC) 8 % solution, Apply topically at bedtime. Apply over nail and surrounding skin. Apply daily over previous coat. Remove weekly with file or polish remover., Disp: 6.6 mL, Rfl: 0 .  ciprofloxacin (CIPRO) 500 MG tablet, TK 1 T PO BID, Disp: , Rfl:  .  CONTOUR TEST test strip, TEST BLOOD SUGAR UP TO QID AS DIRECTED, Disp: , Rfl:  .  HYDROcodone-acetaminophen (NORCO) 5-325 MG per tablet, 1-2 tabs po q6 hours prn pain, Disp: 40 tablet, Rfl: 0 .  levofloxacin (LEVAQUIN) 500 MG tablet, TK 1 T PO QD FOR 7 DAYS, Disp: , Rfl:  .  ondansetron (ZOFRAN-ODT) 4 MG disintegrating tablet, DIS 1 T ON THE TONGUE Q 6 H PRF NAUSEA OR VOM, Disp: , Rfl:  .  oxyCODONE (OXY IR/ROXICODONE) 5 MG immediate release tablet, TK 1 T PO Q 6 H PRF PAIN, Disp: , Rfl:  .  pioglitazone-metformin (ACTOPLUS MET) 15-500 MG per tablet, Take 1 tablet by mouth every morning., Disp: , Rfl:  .  tamsulosin (FLOMAX) 0.4 MG CAPS capsule, TK 1 C PO QD FOR 2 WEEKS, Disp: , Rfl:   Social History   Tobacco Use  Smoking Status Never Smoker  Smokeless Tobacco Never Used    No Known Allergies Objective:  There were no vitals filed for this visit. There is no height or weight on file to calculate BMI. Constitutional Well developed. Well nourished.  Vascular Dorsalis pedis pulses palpable bilaterally. Posterior tibial pulses palpable bilaterally.  Capillary refill normal to all digits.  No cyanosis or clubbing noted. Pedal hair growth normal.  Neurologic Normal speech. Oriented to person, place, and time. Epicritic sensation to light touch grossly present bilaterally.  Dermatologic Nails well groomed and normal in appearance. No open wounds. No skin lesions.  Orthopedic: Normal joint ROM without pain or crepitus bilaterally. No visible deformities. No bony tenderness.   Radiographs: None Assessment:   1. Onychomycosis    Plan:  Patient was evaluated and treated and all questions answered.  Left Hallux oncholysis -Left hallux nail debrided back. No signs of acute infection.  No follow-ups on file.

## 2019-07-13 DIAGNOSIS — E113291 Type 2 diabetes mellitus with mild nonproliferative diabetic retinopathy without macular edema, right eye: Secondary | ICD-10-CM | POA: Diagnosis not present

## 2019-07-13 DIAGNOSIS — H2513 Age-related nuclear cataract, bilateral: Secondary | ICD-10-CM | POA: Diagnosis not present

## 2019-07-13 DIAGNOSIS — H52203 Unspecified astigmatism, bilateral: Secondary | ICD-10-CM | POA: Diagnosis not present

## 2019-07-13 DIAGNOSIS — H01002 Unspecified blepharitis right lower eyelid: Secondary | ICD-10-CM | POA: Diagnosis not present

## 2019-08-04 DIAGNOSIS — Z23 Encounter for immunization: Secondary | ICD-10-CM | POA: Diagnosis not present

## 2019-10-27 IMAGING — DX DG WRIST COMPLETE 3+V*L*
4 series · 4 of 4 positions shown · non-contrast
Comparison: None.

CLINICAL DATA: Bilateral wrist pain.  Fall 4 days ago.

EXAM:
LEFT WRIST - COMPLETE 3+ VIEW

[wrist pa]
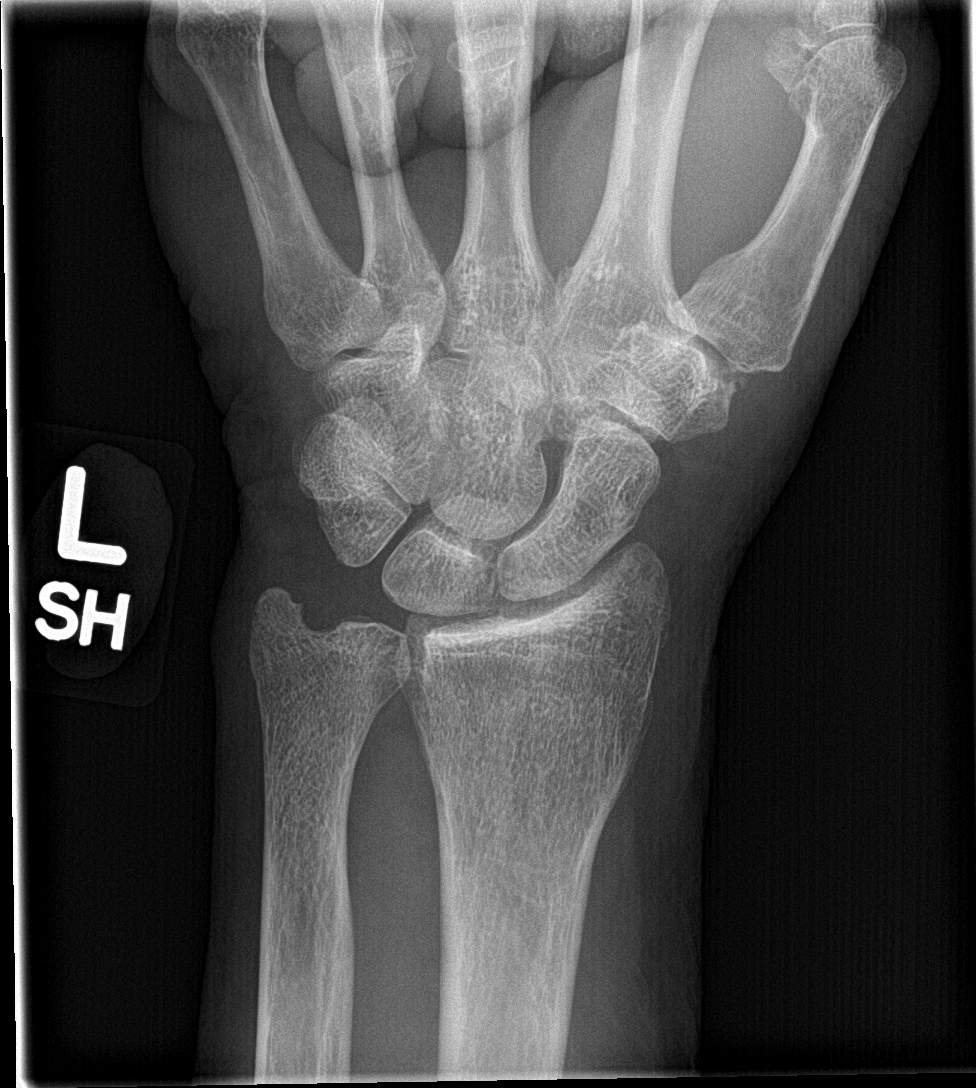

[wrist obl]
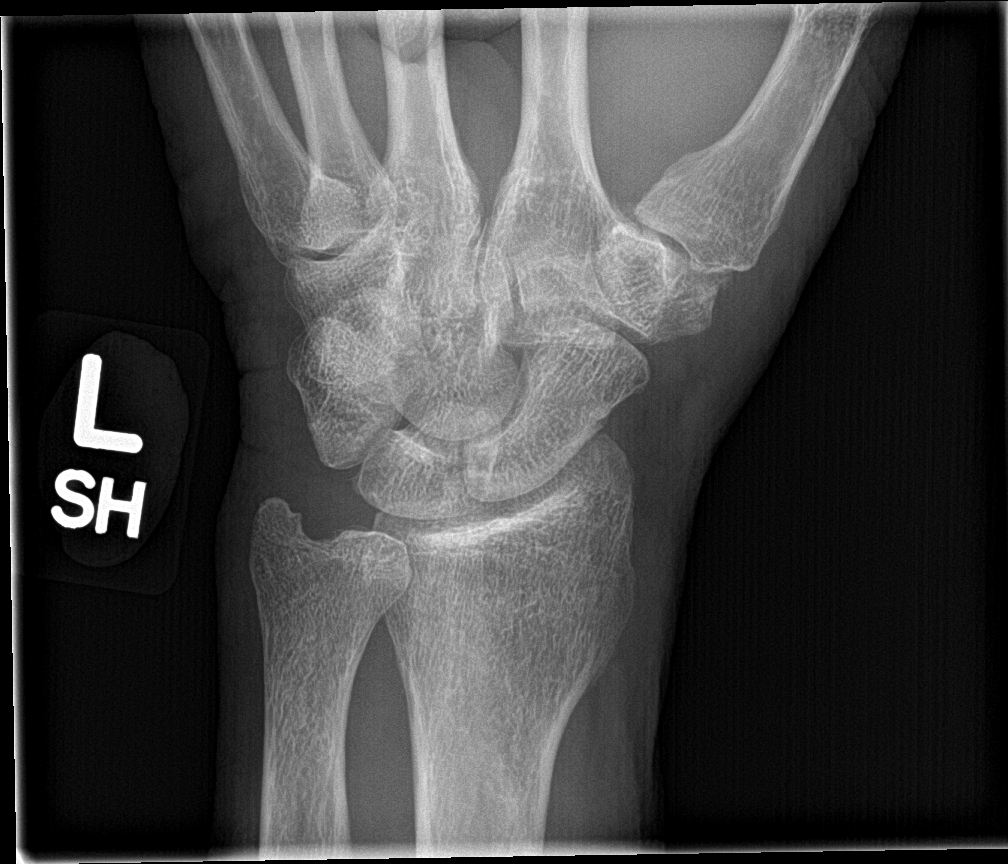

[wrist lat]
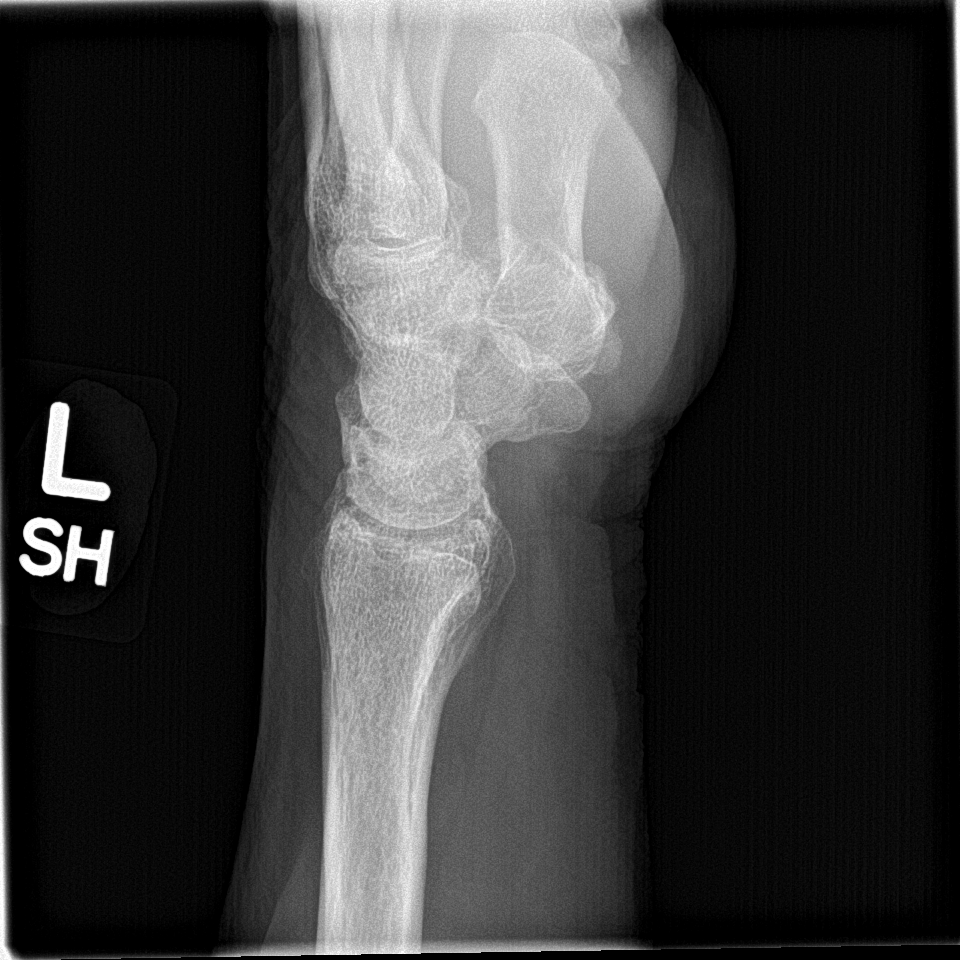

[wrist navicular]
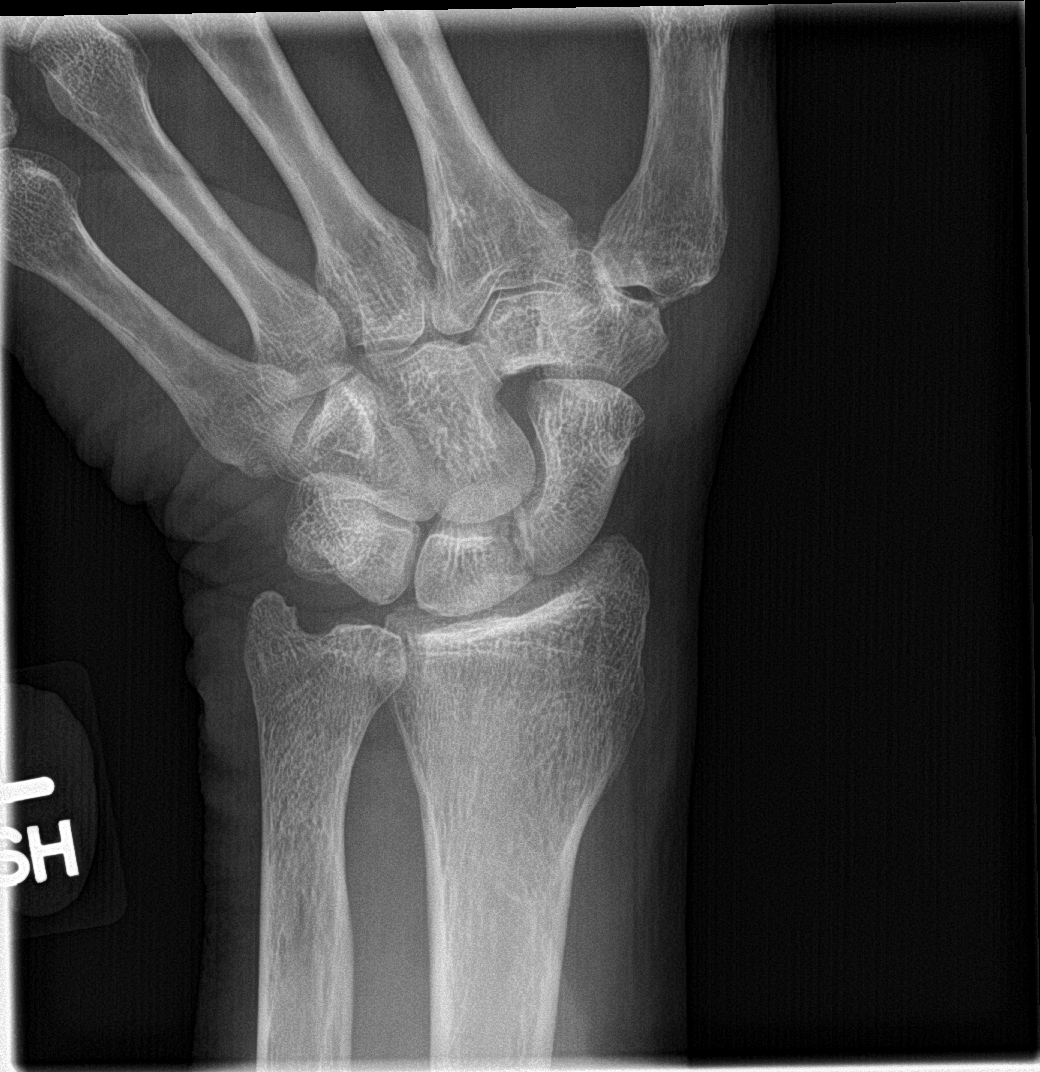

[4 of 4 positions shown; findings below may reference images not displayed]

FINDINGS: There is no evidence of fracture or dislocation. There is no
evidence of arthropathy or other focal bone abnormality. Soft
tissues are unremarkable.
IMPRESSION: Negative.

## 2019-10-27 IMAGING — DX DG WRIST COMPLETE 3+V*R*
4 series · 4 of 4 positions shown · non-contrast
Comparison: None.

CLINICAL DATA: Fall 4 days ago.  Bilateral wrist pain.

EXAM:
RIGHT WRIST - COMPLETE 3+ VIEW

[wrist pa]
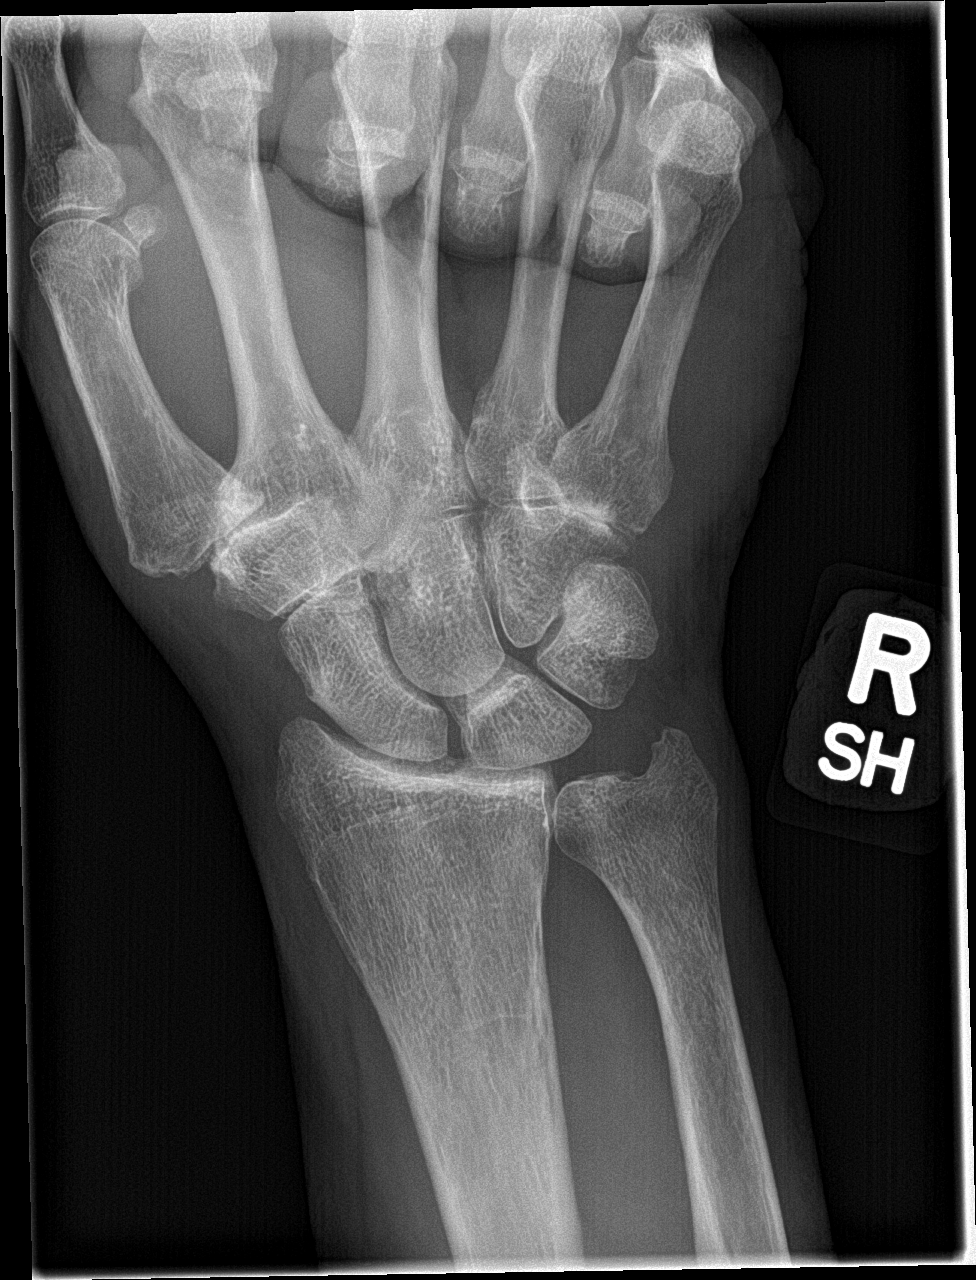

[wrist obl]
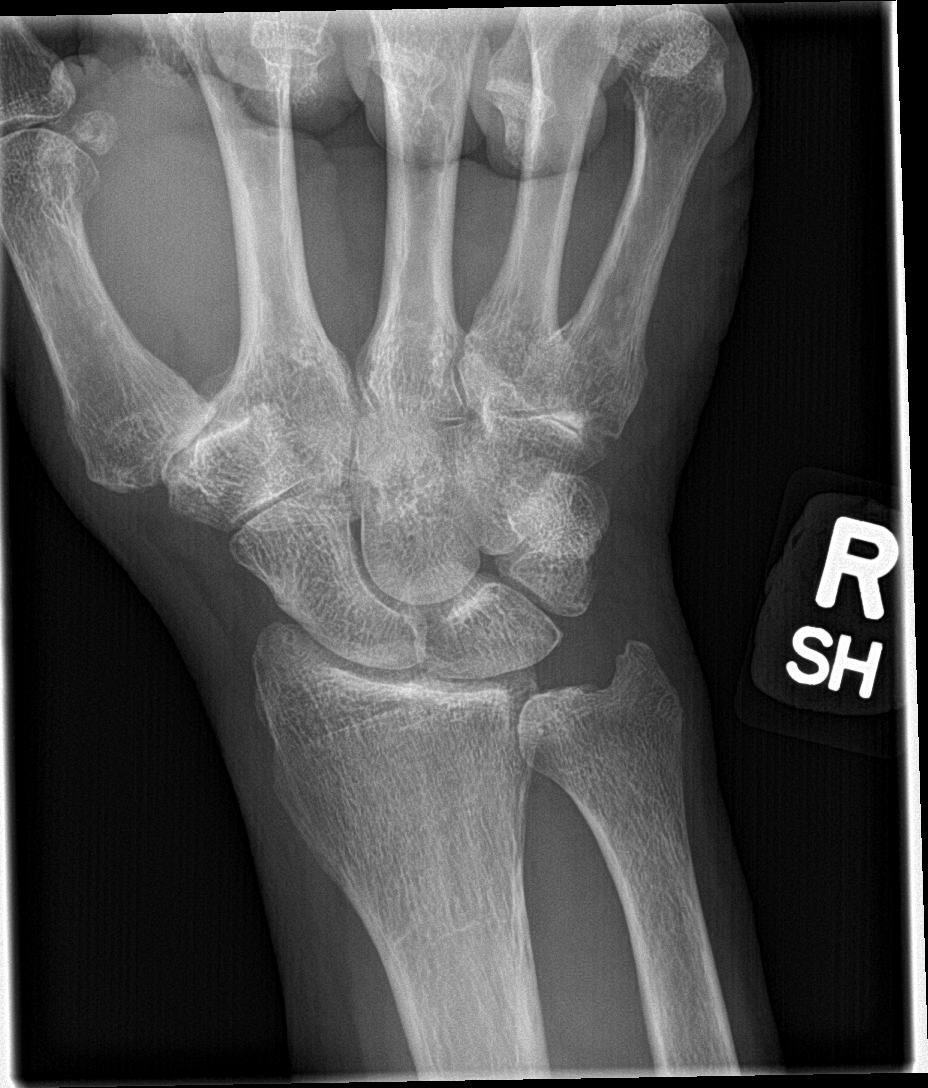

[wrist lat]
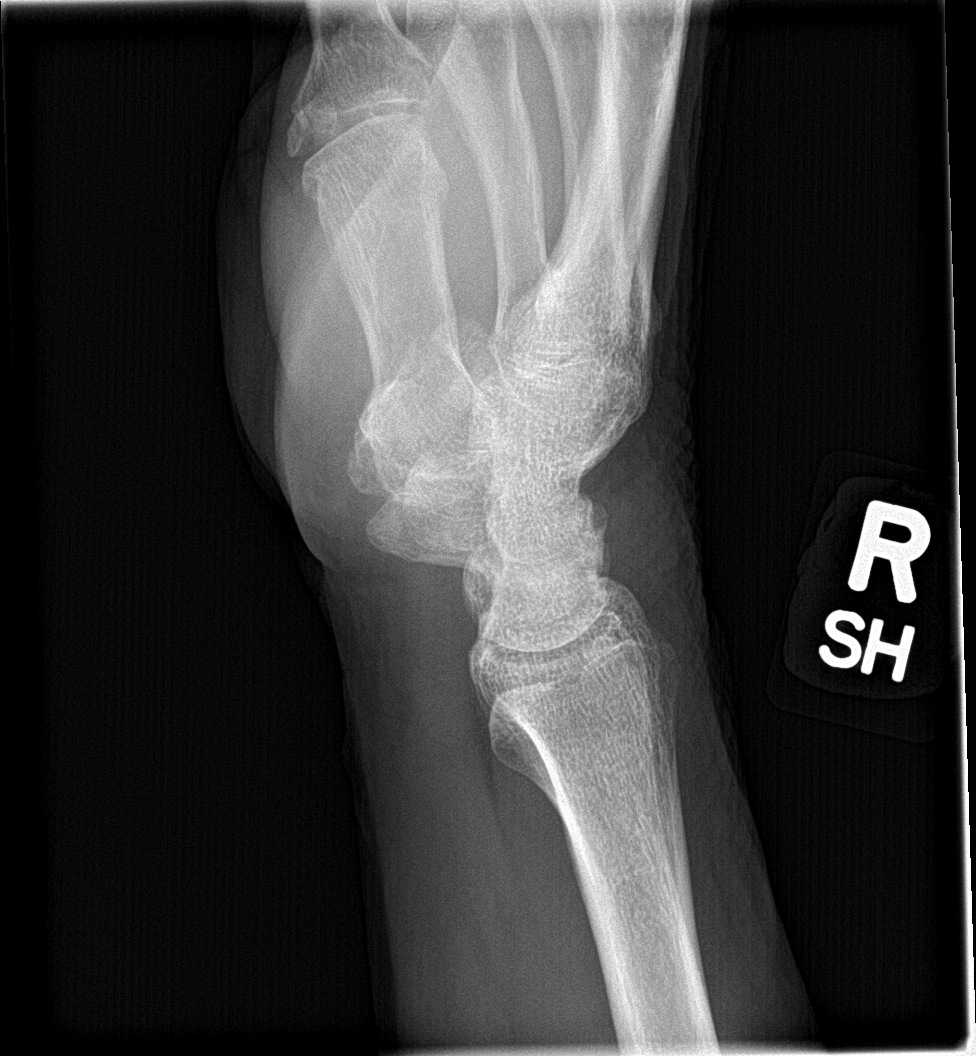

[wrist navicular]
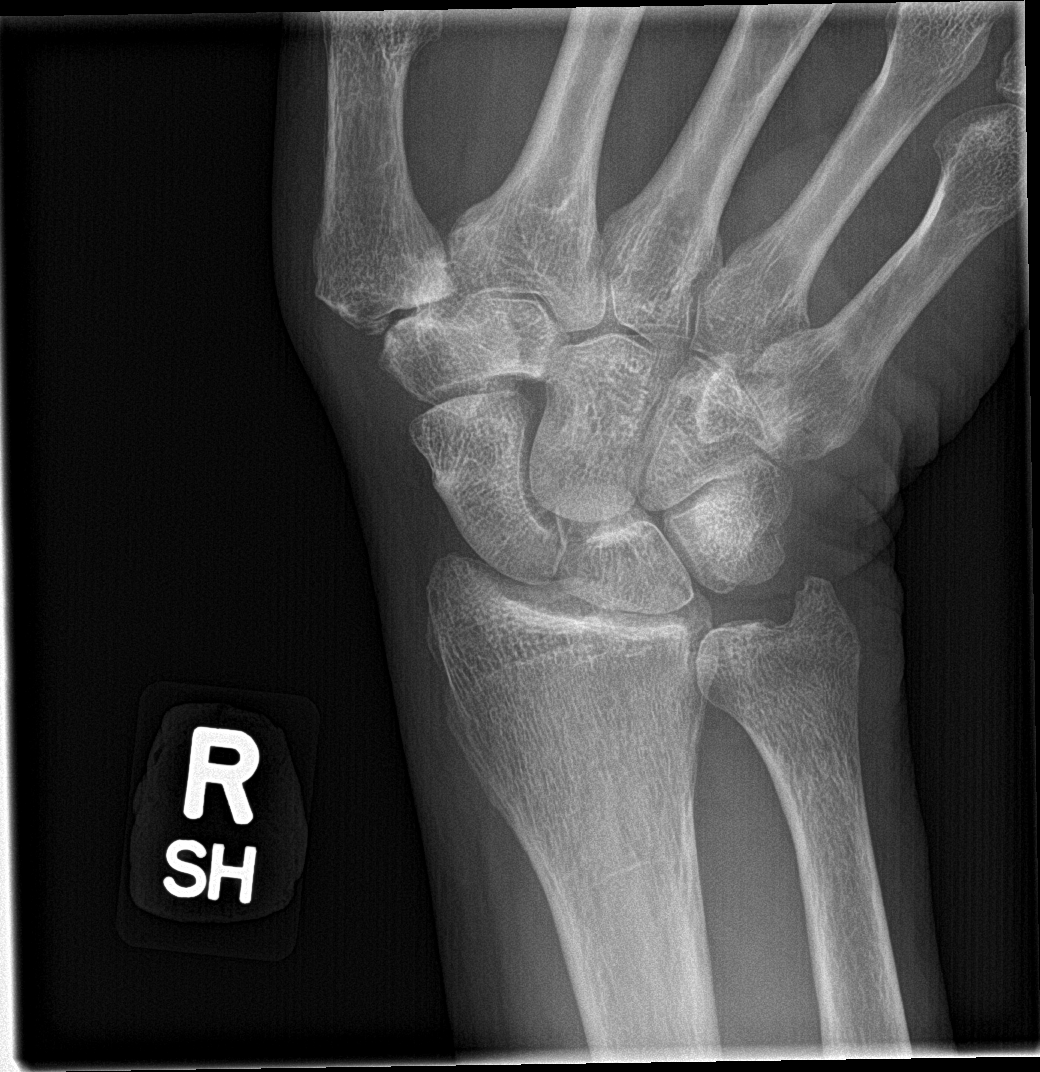

[4 of 4 positions shown; findings below may reference images not displayed]

FINDINGS: There is no evidence of fracture or dislocation. There is no
evidence of arthropathy or other focal bone abnormality. Soft
tissues are unremarkable.
IMPRESSION: Negative.

## 2019-11-03 ENCOUNTER — Ambulatory Visit (INDEPENDENT_AMBULATORY_CARE_PROVIDER_SITE_OTHER): Payer: Medicare Other | Admitting: Family Medicine

## 2019-11-03 ENCOUNTER — Ambulatory Visit
Admission: RE | Admit: 2019-11-03 | Discharge: 2019-11-03 | Disposition: A | Discharge status: Home / Self Care | Payer: Medicare Other | Source: Ambulatory Visit | Attending: Family Medicine | Admitting: Family Medicine

## 2019-11-03 ENCOUNTER — Other Ambulatory Visit: Payer: Self-pay

## 2019-11-03 VITALS — BP 140/74 | Ht 73.0 in | Wt 175.0 lb

## 2019-11-03 DIAGNOSIS — M25551 Pain in right hip: Secondary | ICD-10-CM | POA: Diagnosis not present

## 2019-11-03 NOTE — Patient Instructions (Signed)
We will go ahead with x-rays of your right hip with pelvis. We will contact you with the results likely tomorrow (unless it comes back today). Continue your home exercises and stretches for another 4 weeks. Tylenol as needed for pain. Follow up with me as needed otherwise.

## 2019-11-04 ENCOUNTER — Encounter: Payer: Self-pay | Admitting: Family Medicine

## 2019-11-04 NOTE — Progress Notes (Signed)
PCP: Street, Sharon Mt, MD  Subjective:   HPI: Patient is a 79 y.o. male here for right hip pain.  Patient returns with about 3 weeks of lateral right hip pain. No acute injury but thinks he might have slept wrong. Home exercises helped this issue before - started doing them this time and pain has improved a lot especially past 3 days. He is taking tylenol. No numbness, tingling. No radiation if pain.  Past Medical History:  Diagnosis Date  . Diabetes mellitus without complication ([place])   . Snores   . Wears glasses     Current Outpatient Medications on File Prior to Visit  Medication Sig Dispense Refill  . aspirin 81 MG tablet Take 81 mg by mouth daily.    . ciclopirox (PENLAC) 8 % solution Apply topically at bedtime. Apply over nail and surrounding skin. Apply daily over previous coat. Remove weekly with file or polish remover. 6.6 mL 0  . ciprofloxacin (CIPRO) 500 MG tablet TK 1 T PO BID    . CONTOUR TEST test strip TEST BLOOD SUGAR UP TO QID AS DIRECTED    . HYDROcodone-acetaminophen (NORCO) 5-325 MG per tablet 1-2 tabs po q6 hours prn pain 40 tablet 0  . levofloxacin (LEVAQUIN) 500 MG tablet TK 1 T PO QD FOR 7 DAYS    . ondansetron (ZOFRAN-ODT) 4 MG disintegrating tablet DIS 1 T ON THE TONGUE Q 6 H PRF NAUSEA OR VOM    . oxyCODONE (OXY IR/ROXICODONE) 5 MG immediate release tablet TK 1 T PO Q 6 H PRF PAIN    . pioglitazone-metformin (ACTOPLUS MET) 15-500 MG per tablet Take 1 tablet by mouth every morning.    . tamsulosin (FLOMAX) 0.4 MG CAPS capsule TK 1 C PO QD FOR 2 WEEKS     No current facility-administered medications on file prior to visit.    Past Surgical History:  Procedure Laterality Date  . COLONOSCOPY    . INCISIONAL HERNIA REPAIR     right and left  . TONSILLECTOMY      No Known Allergies  Social History   Socioeconomic History  . Marital status: Married    Spouse name: Not on file  . Number of children: Not on file  . Years of education: Not on  file  . Highest education level: Not on file  Occupational History  . Not on file  Tobacco Use  . Smoking status: Never Smoker  . Smokeless tobacco: Never Used  Substance and Sexual Activity  . Alcohol use: Yes    Alcohol/week: 0.0 standard drinks    Comment: very rare  . Drug use: No  . Sexual activity: Not on file  Other Topics Concern  . Not on file  Social History Narrative  . Not on file   Social Determinants of Health   Financial Resource Strain:   . Difficulty of Paying Living Expenses: Not on file  Food Insecurity:   . Worried About Charity fundraiser in the Last Year: Not on file  . Ran Out of Food in the Last Year: Not on file  Transportation Needs:   . Lack of Transportation (Medical): Not on file  . Lack of Transportation (Non-Medical): Not on file  Physical Activity:   . Days of Exercise per Week: Not on file  . Minutes of Exercise per Session: Not on file  Stress:   . Feeling of Stress : Not on file  Social Connections:   . Frequency of Communication with Friends  and Family: Not on file  . Frequency of Social Gatherings with Friends and Family: Not on file  . Attends Religious Services: Not on file  . Active Member of Clubs or Organizations: Not on file  . Attends Archivist Meetings: Not on file  . Marital Status: Not on file  Intimate Partner Violence:   . Fear of Current or Ex-Partner: Not on file  . Emotionally Abused: Not on file  . Physically Abused: Not on file  . Sexually Abused: Not on file    History reviewed. No pertinent family history.  BP 140/74   Ht 6' 1" (1.854 m)   Wt 175 lb (79.4 kg)   BMI 23.09 kg/m   Review of Systems: See HPI above.     Objective:  Physical Exam:  Gen: NAD, comfortable in exam room  Right hip: No deformity. FROM with 5/5 strength. No tenderness to palpation. NVI distally. Negative logroll, fadir, faber.  Left hip: No deformity. FROM with 5/5 strength. No tenderness to  palpation. NVI distally. Negative logroll, fadir, faber.   Assessment & Plan:  1. Right hip pain - improving.  Exam reassuring and doing better past few days with home exercises.  Consistent with glute minimus strain/spasms based on location of pain previously.  Radiographs ordered and showed only mild arthritis but exam not consistent with this as cuase of pain.  Tylenol as needed.  Home exercises for 4 more weeks.  F/u prn.

## 2019-11-09 DIAGNOSIS — Z20828 Contact with and (suspected) exposure to other viral communicable diseases: Secondary | ICD-10-CM | POA: Diagnosis not present

## 2019-12-21 DIAGNOSIS — E785 Hyperlipidemia, unspecified: Secondary | ICD-10-CM | POA: Diagnosis not present

## 2019-12-21 DIAGNOSIS — E113299 Type 2 diabetes mellitus with mild nonproliferative diabetic retinopathy without macular edema, unspecified eye: Secondary | ICD-10-CM | POA: Diagnosis not present

## 2019-12-21 DIAGNOSIS — E7800 Pure hypercholesterolemia, unspecified: Secondary | ICD-10-CM | POA: Diagnosis not present

## 2019-12-21 DIAGNOSIS — E1169 Type 2 diabetes mellitus with other specified complication: Secondary | ICD-10-CM | POA: Diagnosis not present

## 2020-01-06 DIAGNOSIS — E785 Hyperlipidemia, unspecified: Secondary | ICD-10-CM | POA: Diagnosis not present

## 2020-01-06 DIAGNOSIS — E1169 Type 2 diabetes mellitus with other specified complication: Secondary | ICD-10-CM | POA: Diagnosis not present

## 2020-01-07 DIAGNOSIS — Z0000 Encounter for general adult medical examination without abnormal findings: Secondary | ICD-10-CM | POA: Diagnosis not present

## 2020-01-07 DIAGNOSIS — E785 Hyperlipidemia, unspecified: Secondary | ICD-10-CM | POA: Diagnosis not present

## 2020-01-07 DIAGNOSIS — E113299 Type 2 diabetes mellitus with mild nonproliferative diabetic retinopathy without macular edema, unspecified eye: Secondary | ICD-10-CM | POA: Diagnosis not present

## 2020-01-07 DIAGNOSIS — E1169 Type 2 diabetes mellitus with other specified complication: Secondary | ICD-10-CM | POA: Diagnosis not present

## 2020-01-07 DIAGNOSIS — E7800 Pure hypercholesterolemia, unspecified: Secondary | ICD-10-CM | POA: Diagnosis not present

## 2020-04-17 ENCOUNTER — Other Ambulatory Visit: Payer: Self-pay

## 2020-04-17 ENCOUNTER — Ambulatory Visit (INDEPENDENT_AMBULATORY_CARE_PROVIDER_SITE_OTHER): Payer: Medicare Other | Admitting: Podiatry

## 2020-04-17 DIAGNOSIS — L600 Ingrowing nail: Secondary | ICD-10-CM

## 2020-04-17 DIAGNOSIS — M79676 Pain in unspecified toe(s): Secondary | ICD-10-CM

## 2020-04-17 NOTE — Progress Notes (Signed)
  Subjective:  Patient ID: Aaron May, male    DOB: [birth year],  MRN: [id number]  Chief Complaint  Patient presents with  . Nail Problem    Rt hallux medial border x 15 yrs; comes and goes -pt denies redness/swelling/drainage -4/10 occasional pain Tx: pedicures -wrose with shoes or tight shoes   . Diabetes    FBS: 115 a1C; 7.9 CPP: Street x 6 mo    79 y.o. male presents with the above complaint. History confirmed with patient.   Objective:  Physical Exam: warm, good capillary refill, no trophic changes or ulcerative lesions, normal DP and PT pulses and normal sensory exam.  Painful ingrowing nail at  medial border of the right, hallux; without warmth, erythema or drainage  Assessment:   1. Ingrown nail   2. Pain around toenail    Plan:  Patient was evaluated and treated and all questions answered.  Ingrown Nail, right -Debrided in slant back fashion -Educated on self-care  No follow-ups on file.

## 2020-07-25 DIAGNOSIS — Z23 Encounter for immunization: Secondary | ICD-10-CM | POA: Diagnosis not present

## 2020-08-02 DIAGNOSIS — D1801 Hemangioma of skin and subcutaneous tissue: Secondary | ICD-10-CM | POA: Diagnosis not present

## 2020-08-02 DIAGNOSIS — D225 Melanocytic nevi of trunk: Secondary | ICD-10-CM | POA: Diagnosis not present

## 2020-08-02 DIAGNOSIS — L821 Other seborrheic keratosis: Secondary | ICD-10-CM | POA: Diagnosis not present

## 2020-08-02 DIAGNOSIS — D2272 Melanocytic nevi of left lower limb, including hip: Secondary | ICD-10-CM | POA: Diagnosis not present

## 2020-08-02 DIAGNOSIS — D2271 Melanocytic nevi of right lower limb, including hip: Secondary | ICD-10-CM | POA: Diagnosis not present

## 2020-08-28 DIAGNOSIS — H52203 Unspecified astigmatism, bilateral: Secondary | ICD-10-CM | POA: Diagnosis not present

## 2020-08-28 DIAGNOSIS — H2513 Age-related nuclear cataract, bilateral: Secondary | ICD-10-CM | POA: Diagnosis not present

## 2020-08-28 DIAGNOSIS — E119 Type 2 diabetes mellitus without complications: Secondary | ICD-10-CM | POA: Diagnosis not present

## 2020-09-04 DIAGNOSIS — N4822 Cellulitis of corpus cavernosum and penis: Secondary | ICD-10-CM | POA: Diagnosis not present

## 2020-09-14 DIAGNOSIS — R21 Rash and other nonspecific skin eruption: Secondary | ICD-10-CM | POA: Diagnosis not present

## 2020-09-14 DIAGNOSIS — N401 Enlarged prostate with lower urinary tract symptoms: Secondary | ICD-10-CM | POA: Diagnosis not present

## 2020-09-14 DIAGNOSIS — Z79899 Other long term (current) drug therapy: Secondary | ICD-10-CM | POA: Diagnosis not present

## 2020-09-18 DIAGNOSIS — E7800 Pure hypercholesterolemia, unspecified: Secondary | ICD-10-CM | POA: Diagnosis not present

## 2020-09-18 DIAGNOSIS — Z79899 Other long term (current) drug therapy: Secondary | ICD-10-CM | POA: Diagnosis not present

## 2020-09-18 DIAGNOSIS — E1169 Type 2 diabetes mellitus with other specified complication: Secondary | ICD-10-CM | POA: Diagnosis not present

## 2020-09-18 DIAGNOSIS — E785 Hyperlipidemia, unspecified: Secondary | ICD-10-CM | POA: Diagnosis not present

## 2020-09-18 DIAGNOSIS — E113299 Type 2 diabetes mellitus with mild nonproliferative diabetic retinopathy without macular edema, unspecified eye: Secondary | ICD-10-CM | POA: Diagnosis not present

## 2020-09-25 ENCOUNTER — Encounter: Payer: Self-pay | Admitting: Family Medicine

## 2020-09-25 ENCOUNTER — Ambulatory Visit (INDEPENDENT_AMBULATORY_CARE_PROVIDER_SITE_OTHER): Payer: Medicare Other | Admitting: Family Medicine

## 2020-09-25 ENCOUNTER — Other Ambulatory Visit: Payer: Self-pay

## 2020-09-25 VITALS — BP 138/74 | Ht 73.0 in | Wt 180.0 lb

## 2020-09-25 DIAGNOSIS — M25561 Pain in right knee: Secondary | ICD-10-CM

## 2020-09-25 NOTE — Patient Instructions (Signed)
You have patellofemoral syndrome. Avoid painful activities when possible (going up stairs with painful leg first, very deep squats, deep lunges bother this). Cross train with swimming, cycling with low resistance, elliptical if needed. Straight leg raise, hip side raises, straight leg raises with foot turned outwards 3 sets of 10 once a day - these are very important for long term recovery. Add ankle weight if these become too easy. Consider formal physical therapy. Avoid flat shoes, barefoot walking as much as possible. Icing 15 minutes at a time 3-4 times a day as needed. Tylenol or ibuprofen as needed for pain. Follow up with me in 6 weeks but call me sooner if you're struggling.

## 2020-09-26 ENCOUNTER — Encounter: Payer: Self-pay | Admitting: Family Medicine

## 2020-09-26 NOTE — Progress Notes (Signed)
PCP: Street, Sharon Mt, MD  Subjective:   HPI: Patient is a 79 y.o. male here for right knee pain.  Patient denies known injury or trauma. Over the past 2 weeks he's noticed pain anterior right knee especially when going up stairs with right leg as his lead leg. No swelling, bruising. No pain with going down stairs. No catching or locking. Feels like knee is becoming 'unhinged' when this occurs.  Past Medical History:  Diagnosis Date  . Diabetes mellitus without complication ([place])   . Snores   . Wears glasses     Current Outpatient Medications on File Prior to Visit  Medication Sig Dispense Refill  . aspirin 81 MG tablet Take 81 mg by mouth daily.    . CONTOUR TEST test strip TEST BLOOD SUGAR UP TO QID AS DIRECTED    . pioglitazone-metformin (ACTOPLUS MET) 15-500 MG per tablet Take 1 tablet by mouth every morning.    . tamsulosin (FLOMAX) 0.4 MG CAPS capsule TK 1 C PO QD FOR 2 WEEKS     No current facility-administered medications on file prior to visit.    Past Surgical History:  Procedure Laterality Date  . COLONOSCOPY    . INCISIONAL HERNIA REPAIR     right and left  . TONSILLECTOMY      No Known Allergies  Social History   Socioeconomic History  . Marital status: Married    Spouse name: Not on file  . Number of children: Not on file  . Years of education: Not on file  . Highest education level: Not on file  Occupational History  . Not on file  Tobacco Use  . Smoking status: Never Smoker  . Smokeless tobacco: Never Used  Substance and Sexual Activity  . Alcohol use: Yes    Alcohol/week: 0.0 standard drinks    Comment: very rare  . Drug use: No  . Sexual activity: Not on file  Other Topics Concern  . Not on file  Social History Narrative  . Not on file   Social Determinants of Health   Financial Resource Strain:   . Difficulty of Paying Living Expenses: Not on file  Food Insecurity:   . Worried About Charity fundraiser in the Last Year: Not  on file  . Ran Out of Food in the Last Year: Not on file  Transportation Needs:   . Lack of Transportation (Medical): Not on file  . Lack of Transportation (Non-Medical): Not on file  Physical Activity:   . Days of Exercise per Week: Not on file  . Minutes of Exercise per Session: Not on file  Stress:   . Feeling of Stress : Not on file  Social Connections:   . Frequency of Communication with Friends and Family: Not on file  . Frequency of Social Gatherings with Friends and Family: Not on file  . Attends Religious Services: Not on file  . Active Member of Clubs or Organizations: Not on file  . Attends Archivist Meetings: Not on file  . Marital Status: Not on file  Intimate Partner Violence:   . Fear of Current or Ex-Partner: Not on file  . Emotionally Abused: Not on file  . Physically Abused: Not on file  . Sexually Abused: Not on file    History reviewed. No pertinent family history.  BP 138/74   Ht _0  (1.854 m)   Wt 180 lb (81.6 kg)   BMI 23.75 kg/m   [facility] Adult Exercise  09/25/2020  Frequency of aerobic exercise (# of days/week) 3  Average time in minutes 60  Frequency of strengthening activities (# of days/week) 3    No flowsheet data found.  Review of Systems: See HPI above.     Objective:  Physical Exam:  Gen: NAD, comfortable in exam room  Right knee: No gross deformity, ecchymoses, swelling.  VMO atrophy. No TTP joint lines, post patellar facets. FROM with normal strength. Negative ant/post drawers. Negative valgus/varus testing. Negative lachmans. Negative mcmurrays, apleys, thessalys, patellar apprehension. NV intact distally.   Assessment & Plan:  1. Right knee pain - consistent with patellofemoral syndrome given location of his pain, bothersome with stairs, leg press.  Exam is reassuring though he does have VMO atrophy.  Reassured patient.  Home exercise program reviewed.  Icing, tylenol or ibuprofen only if needed.   Consider physical therapy if not improving.  F/u in 6 weeks.

## 2020-10-10 DIAGNOSIS — S61210A Laceration without foreign body of right index finger without damage to nail, initial encounter: Secondary | ICD-10-CM | POA: Diagnosis not present

## 2020-11-01 ENCOUNTER — Ambulatory Visit (INDEPENDENT_AMBULATORY_CARE_PROVIDER_SITE_OTHER): Payer: Medicare Other | Admitting: Family Medicine

## 2020-11-01 ENCOUNTER — Other Ambulatory Visit: Payer: Self-pay

## 2020-11-01 VITALS — BP 124/72 | Ht 72.0 in | Wt 180.0 lb

## 2020-11-01 DIAGNOSIS — M25561 Pain in right knee: Secondary | ICD-10-CM

## 2020-11-01 NOTE — Progress Notes (Signed)
PCP: Street, Sharon Mt, MD  Subjective:   HPI: Patient is a 80 y.o. male here for follow up of right knee pain.  Patient denies recent injury or trauma. Reports his knee is "90%"  Improved. He has been doing his physical therapy recommended exercises at home. He still has trouble taking the stairs but reports that activity level is back to where he was 6 weeks ago. No swelling, bruising, pain, catching and locking.  Past Medical History:  Diagnosis Date  . Diabetes mellitus without complication ([place])   . Snores   . Wears glasses     Current Outpatient Medications on File Prior to Visit  Medication Sig Dispense Refill  . aspirin 81 MG tablet Take 81 mg by mouth daily.    . CONTOUR TEST test strip TEST BLOOD SUGAR UP TO QID AS DIRECTED    . pioglitazone-metformin (ACTOPLUS MET) 15-500 MG per tablet Take 1 tablet by mouth every morning.    . tamsulosin (FLOMAX) 0.4 MG CAPS capsule TK 1 C PO QD FOR 2 WEEKS     No current facility-administered medications on file prior to visit.    Past Surgical History:  Procedure Laterality Date  . COLONOSCOPY    . INCISIONAL HERNIA REPAIR     right and left  . TONSILLECTOMY      No Known Allergies  Social History   Socioeconomic History  . Marital status: Married    Spouse name: Not on file  . Number of children: Not on file  . Years of education: Not on file  . Highest education level: Not on file  Occupational History  . Not on file  Tobacco Use  . Smoking status: Never Smoker  . Smokeless tobacco: Never Used  Substance and Sexual Activity  . Alcohol use: Yes    Alcohol/week: 0.0 standard drinks    Comment: very rare  . Drug use: No  . Sexual activity: Not on file  Other Topics Concern  . Not on file  Social History Narrative  . Not on file   Social Determinants of Health   Financial Resource Strain: Not on file  Food Insecurity: Not on file  Transportation Needs: Not on file  Physical Activity: Not on file   Stress: Not on file  Social Connections: Not on file  Intimate Partner Violence: Not on file    No family history on file.  BP 124/72   Ht 6' (1.829 m)   Wt 180 lb (81.6 kg)   BMI 24.41 kg/m   [facility] Adult Exercise 09/25/2020  Frequency of aerobic exercise (# of days/week) 3  Average time in minutes 60  Frequency of strengthening activities (# of days/week) 3    No flowsheet data found.  Review of Systems: See HPI above.     Objective:  Physical Exam:  Gen: NAD, comfortable in exam room  Right knee: No gross deformity, ecchymoses, swelling.  VMO with increased mass compared to last visit. No patellar or joint line tenderness. Full active and passive range of motion with strength 5/5.Marland Kitchen Negative anterior and posterior drawers. Negative valgus and varus testing. Negative Mcmurrays. 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 Continue home exercises. Follow-up as needed.    Lyndee Hensen, DO PGY-2, [facility] 11/01/2020

## 2020-11-02 ENCOUNTER — Encounter: Payer: Self-pay | Admitting: Family Medicine

## 2020-11-02 DIAGNOSIS — Z125 Encounter for screening for malignant neoplasm of prostate: Secondary | ICD-10-CM | POA: Diagnosis not present

## 2020-11-02 DIAGNOSIS — N401 Enlarged prostate with lower urinary tract symptoms: Secondary | ICD-10-CM | POA: Diagnosis not present

## 2020-11-27 ENCOUNTER — Encounter: Payer: Self-pay | Admitting: Podiatry

## 2020-11-27 ENCOUNTER — Ambulatory Visit (INDEPENDENT_AMBULATORY_CARE_PROVIDER_SITE_OTHER): Payer: Medicare Other | Admitting: Podiatry

## 2020-11-27 ENCOUNTER — Other Ambulatory Visit: Payer: Self-pay

## 2020-11-27 DIAGNOSIS — B351 Tinea unguium: Secondary | ICD-10-CM | POA: Diagnosis not present

## 2020-11-27 DIAGNOSIS — R234 Changes in skin texture: Secondary | ICD-10-CM | POA: Diagnosis not present

## 2020-11-27 NOTE — Progress Notes (Signed)
  Subjective:  Patient ID: Aaron May, male    DOB: 1941/05/20,  MRN: [id number]  Chief Complaint  Patient presents with  . Nail Problem    Toenail right thick   . Foot Problem    I have a slit on the ball of the right foot and I think I wore the wrong pair of shoes     80 y.o. male presents with the above complaint. History confirmed with patient.   Objective:  Physical Exam: warm, good capillary refill, no trophic changes or ulcerative lesions, normal DP and PT pulses and normal sensory exam. Right Foot: Right hallux nail partial lysis, cracking, dystrophy. Xerosis plantar foot with small skin fissure at medial 1st Met   Assessment:   1. Onychomycosis   2. Skin fissure      Plan:  Patient was evaluated and treated and all questions answered.  Onycholysis Right hallux -Nail debrided  Skin fissure -Apply neosporin until healed and then apply lotion to prevent cracking.  Return if symptoms worsen or fail to improve.

## 2020-12-21 DIAGNOSIS — K649 Unspecified hemorrhoids: Secondary | ICD-10-CM | POA: Diagnosis not present

## 2020-12-25 DIAGNOSIS — E7800 Pure hypercholesterolemia, unspecified: Secondary | ICD-10-CM | POA: Diagnosis not present

## 2020-12-25 DIAGNOSIS — K648 Other hemorrhoids: Secondary | ICD-10-CM | POA: Diagnosis not present

## 2020-12-25 DIAGNOSIS — E663 Overweight: Secondary | ICD-10-CM | POA: Diagnosis not present

## 2020-12-25 DIAGNOSIS — E1169 Type 2 diabetes mellitus with other specified complication: Secondary | ICD-10-CM | POA: Diagnosis not present

## 2021-01-15 DIAGNOSIS — E7800 Pure hypercholesterolemia, unspecified: Secondary | ICD-10-CM | POA: Diagnosis not present

## 2021-01-15 DIAGNOSIS — E1169 Type 2 diabetes mellitus with other specified complication: Secondary | ICD-10-CM | POA: Diagnosis not present

## 2021-01-15 DIAGNOSIS — E113299 Type 2 diabetes mellitus with mild nonproliferative diabetic retinopathy without macular edema, unspecified eye: Secondary | ICD-10-CM | POA: Diagnosis not present

## 2021-01-15 DIAGNOSIS — Z0000 Encounter for general adult medical examination without abnormal findings: Secondary | ICD-10-CM | POA: Diagnosis not present

## 2021-01-15 DIAGNOSIS — G629 Polyneuropathy, unspecified: Secondary | ICD-10-CM | POA: Diagnosis not present

## 2021-01-16 DIAGNOSIS — N401 Enlarged prostate with lower urinary tract symptoms: Secondary | ICD-10-CM | POA: Diagnosis not present

## 2021-01-16 DIAGNOSIS — Z79899 Other long term (current) drug therapy: Secondary | ICD-10-CM | POA: Diagnosis not present

## 2021-01-16 DIAGNOSIS — E785 Hyperlipidemia, unspecified: Secondary | ICD-10-CM | POA: Diagnosis not present

## 2021-01-16 DIAGNOSIS — E1169 Type 2 diabetes mellitus with other specified complication: Secondary | ICD-10-CM | POA: Diagnosis not present

## 2021-01-23 DIAGNOSIS — K644 Residual hemorrhoidal skin tags: Secondary | ICD-10-CM | POA: Diagnosis not present

## 2021-01-23 DIAGNOSIS — K6289 Other specified diseases of anus and rectum: Secondary | ICD-10-CM | POA: Diagnosis not present

## 2021-03-06 DIAGNOSIS — K644 Residual hemorrhoidal skin tags: Secondary | ICD-10-CM | POA: Diagnosis not present

## 2021-03-10 DIAGNOSIS — Z20822 Contact with and (suspected) exposure to covid-19: Secondary | ICD-10-CM | POA: Diagnosis not present

## 2021-03-13 ENCOUNTER — Ambulatory Visit (INDEPENDENT_AMBULATORY_CARE_PROVIDER_SITE_OTHER): Payer: Medicare Other | Admitting: Sports Medicine

## 2021-03-13 ENCOUNTER — Other Ambulatory Visit: Payer: Self-pay

## 2021-03-13 DIAGNOSIS — L601 Onycholysis: Secondary | ICD-10-CM

## 2021-03-13 DIAGNOSIS — B351 Tinea unguium: Secondary | ICD-10-CM | POA: Diagnosis not present

## 2021-03-13 DIAGNOSIS — E119 Type 2 diabetes mellitus without complications: Secondary | ICD-10-CM

## 2021-03-13 NOTE — Progress Notes (Signed)
Subjective: Aaron May is a 80 y.o. male patient seen today in office with complaint thick lifting nail Left>Right 1st toe nail.  Reports that he has noticed the left toenail getting almost a double nail appearance and very thick.  Patient reports that he goes for a pedicure but just wanted to have his toenails checked.  Patient denies any pain redness warmth swelling or drainage from the affected nails.  Fasting blood sugar 162 A1c 7.1  Patient Active Problem List   Diagnosis Date Noted  . Right hip pain 05/21/2017  . Right knee pain 12/28/2015  . Trapezius strain 12/04/2015    Current Outpatient Medications on File Prior to Visit  Medication Sig Dispense Refill  . aspirin 81 MG tablet Take 81 mg by mouth daily.    Marland Kitchen BINAXNOW COVID-19 AG HOME TEST KIT TEST AS DIRECTED TODAY    . CONTOUR TEST test strip TEST BLOOD SUGAR UP TO QID AS DIRECTED    . pioglitazone-metformin (ACTOPLUS MET) 15-500 MG per tablet Take 1 tablet by mouth every morning.    Marland Kitchen STOOL SOFTENER 100 MG capsule Take 100 mg by mouth 2 (two) times daily.    . tamsulosin (FLOMAX) 0.4 MG CAPS capsule TK 1 C PO QD FOR 2 WEEKS     No current facility-administered medications on file prior to visit.    No Known Allergies  Objective: Physical Exam  General: Well developed, nourished, no acute distress, awake, alert and oriented x 3  Vascular: Dorsalis pedis artery 1/4 bilateral, Posterior tibial artery 1/4 bilateral, skin temperature warm to warm proximal to distal bilateral lower extremities, no varicosities, pedal hair present bilateral.  Neurological: Gross sensation present via light touch bilateral.   Dermatological: There is thickening noted bilateral hallux with subungual debris there is distal lysis of the left hallux nail and lifting from the nail plate likely evidence of previous old trauma with no surrounding signs of infection.  There is dry skin plantarly with no open lesions.  Musculoskeletal:   Asymptomatic bunion boney deformities noted bilateral. Muscular strength within normal limits without painon range of motion. No pain with calf compression bilateral.  Assessment and Plan:  Problem List Items Addressed This Visit   None   Visit Diagnoses    Onychomycosis    -  Primary   Onycholysis       Diabetes mellitus without complication ([place])           -Examined patient.  -Discussed treatment options for painful mycotic nails with lysis distally -Patient declined medication or a nail procedure at this time. -Mechanically debrided and reduced mycotic nails bilateral hallux with sterile nail nipper and dremel nail file without incident. -Advised patient that likely the loose area of the nail will grow out and fall off needs to monitor and be very careful not to bump or to snagged the loose portion of the toenail -Patient to return if fails to continue to improve or sooner if symptoms worsen.  Landis Martins, DPM

## 2021-04-11 ENCOUNTER — Other Ambulatory Visit: Payer: Self-pay

## 2021-04-11 ENCOUNTER — Ambulatory Visit (INDEPENDENT_AMBULATORY_CARE_PROVIDER_SITE_OTHER): Payer: Medicare Other | Admitting: Family Medicine

## 2021-04-11 ENCOUNTER — Encounter: Payer: Self-pay | Admitting: Family Medicine

## 2021-04-11 DIAGNOSIS — T148XXA Other injury of unspecified body region, initial encounter: Secondary | ICD-10-CM

## 2021-04-11 NOTE — Patient Instructions (Signed)
You have an oblique strain. Do home exercises most days of the week as directed. Heat 15 minutes at a time 3-4 times a day. Topical voltaren gel up to 4 times a day can help with pain only if needed. Follow up with me in 5-6 weeks but call me sooner if this isn't improving as expected.

## 2021-04-11 NOTE — Progress Notes (Signed)
    SUBJECTIVE:   CHIEF COMPLAINT / HPI:   Kassim Guertin is a 80 yr old male who presents with right hip pain  Right hip pain Seen Dr Barbaraann Barthel for right lateral sided hip pain 3 years ago. Found to have gluteus medius/minimus strain and sartorius strain. Xray: mild degenerative changes of the hip. Given gluteal exercises. Pt works out in [employer] 3 times a week and lifts 10lb weights. He has noticed the pain more when standing from sitting. Pain more superior than prior pain. The pain is improved by exercise and movement.  Takes Tylenol 650mg  Q6H for the pain which helps. Denies LE weakness or paraesthesia.    PERTINENT  PMH / PSH: right hip pain, left hip pain   OBJECTIVE:   BP (!) 144/80   Ht 6\' 1"  (1.854 m)   Wt 175 lb (79.4 kg)   BMI 23.09 kg/m    Hip:  - Inspection: No gross deformity, no swelling, erythema, or ecchymosis - Palpation: TTP superior to iliac crest laterally. - ROM: Normal range of motion on Flexion, extension, abduction, internal and external rotation - Strength: Normal strength. - Neuro/vasc: NV intact distally - Special Tests: Negative Trendelenburg, Corky Sox, Fadir.    ASSESSMENT/PLAN:   Muscle strain Most likely oblique muscle stain 2/2 twisting movement while working out. Provided pt with oblique muscle strengthening exercises. Pt should continue hip strengthening exercises. Follow up as needed.  Heat, voltaren gel.     Lattie Haw, MD [place]

## 2021-04-11 NOTE — Assessment & Plan Note (Signed)
Most likely oblique muscle stain 2/2 twisting movement while working out. Provided pt with oblique muscle strengthening exercises. Pt should continue hip strengthening exercises. Follow up as needed.

## 2021-05-02 DIAGNOSIS — N401 Enlarged prostate with lower urinary tract symptoms: Secondary | ICD-10-CM | POA: Diagnosis not present

## 2021-05-12 DIAGNOSIS — S80861A Insect bite (nonvenomous), right lower leg, initial encounter: Secondary | ICD-10-CM | POA: Diagnosis not present

## 2021-05-23 ENCOUNTER — Ambulatory Visit: Payer: Medicare Other | Admitting: Family Medicine

## 2021-05-28 ENCOUNTER — Ambulatory Visit (INDEPENDENT_AMBULATORY_CARE_PROVIDER_SITE_OTHER): Payer: Medicare Other | Admitting: Podiatry

## 2021-05-28 ENCOUNTER — Other Ambulatory Visit: Payer: Self-pay

## 2021-05-28 DIAGNOSIS — L601 Onycholysis: Secondary | ICD-10-CM

## 2021-05-28 DIAGNOSIS — B351 Tinea unguium: Secondary | ICD-10-CM

## 2021-05-28 MED ORDER — CICLOPIROX 8 % EX SOLN: Freq: Every day | CUTANEOUS | 0 refills | Status: DC

## 2021-05-28 NOTE — Progress Notes (Signed)
  Subjective:  Patient ID: Aaron May, male    DOB: [birth year],  MRN: [id number]  Chief Complaint  Patient presents with   Nail Problem    Lt hallux nail cam off x last Friday - no pain/redness/swellign [id number]   80 y.o. male presents with the above complaint. History confirmed with patient.   Objective:  Physical Exam: warm, good capillary refill, no trophic changes or ulcerative lesions, normal DP and PT pulses, and normal sensory exam. Left Foot: left hallux nail ~75% lysis, adhered proximally, keratinization of the nail bed noted.   Right Foot: right hallux nail, lesser nails dystrophic changes distally.   Assessment:   1. Onycholysis   2. Onychomycosis    Plan:  Patient was evaluated and treated and all questions answered.  Nail dystrophy, onycholysis -Educated on traumatic changes to both nails but L>R. Debrided loose nail today. -Likely some fungal component right. Rx Penlac. -Educated on soaking of nails so that the skin softens under the nails as the nail grows  Return in about 2 months (around 07/28/2021) for Nail Fungus.

## 2021-05-29 ENCOUNTER — Telehealth: Payer: Self-pay | Admitting: Podiatry

## 2021-05-29 MED ORDER — CICLOPIROX 8 % EX SOLN: Freq: Every day | CUTANEOUS | 0 refills | Status: DC

## 2021-05-29 NOTE — Addendum Note (Signed)
Addended by: Hardie Pulley on: 05/29/2021 08:21 AM   Modules accepted: Orders

## 2021-05-29 NOTE — Telephone Encounter (Signed)
Pts rx from yesterday was sent to [facility], it was suppose to got to [facility] you resend pls.

## 2021-05-30 ENCOUNTER — Other Ambulatory Visit: Payer: Self-pay

## 2021-05-30 ENCOUNTER — Encounter: Payer: Self-pay | Admitting: Family Medicine

## 2021-05-30 ENCOUNTER — Ambulatory Visit (INDEPENDENT_AMBULATORY_CARE_PROVIDER_SITE_OTHER): Payer: Medicare Other | Admitting: Family Medicine

## 2021-05-30 VITALS — BP 122/72 | Ht 73.0 in | Wt 175.0 lb

## 2021-05-30 DIAGNOSIS — M25551 Pain in right hip: Secondary | ICD-10-CM

## 2021-05-30 DIAGNOSIS — T148XXA Other injury of unspecified body region, initial encounter: Secondary | ICD-10-CM

## 2021-05-30 NOTE — Progress Notes (Signed)
PCP: Street, Sharon Mt, MD  Subjective:   HPI: Patient is a 80 y.o. male here for right hip pain.  6/15: Right hip pain Seen Dr Barbaraann Barthel for right lateral sided hip pain 3 years ago. Found to have gluteus medius/minimus strain and sartorius strain. Xray: mild degenerative changes of the hip. Given gluteal exercises. Pt works out in [employer] 3 times a week and lifts 10lb weights. He has noticed the pain more when standing from sitting. Pain more superior than prior pain. The pain is improved by exercise and movement.  Takes Tylenol 654m Q6H for the pain which helps. Denies LE weakness or paraesthesia.    8/3: Patient reports his hip pain has resolved. Did combination of home exercises provided and had a lot of soreness especially in low back. Rested from these and did his usual exercises for knee and crunches and pain has resolved. Curious about which exercises he should perform going forward and plans to ease into doing them.  Past Medical History:  Diagnosis Date   Diabetes mellitus without complication ([facility])    Snores    Wears glasses     Current Outpatient Medications on File Prior to Visit  Medication Sig Dispense Refill   aspirin 81 MG tablet Take 81 mg by mouth daily.     BINAXNOW COVID-19 AG HOME TEST KIT TEST AS DIRECTED TODAY     ciclopirox (PENLAC) 8 % solution Apply topically at bedtime. Apply over nail and surrounding skin. Apply daily over previous coat. Remove weekly with file or polish remover. 6.6 mL 0   CONTOUR TEST test strip TEST BLOOD SUGAR UP TO QID AS DIRECTED     metFORMIN (GLUCOPHAGE) 1000 MG tablet Take 1 tablet by mouth 2 (two) times daily.     pioglitazone (ACTOS) 30 MG tablet Take 30 mg by mouth daily.     STOOL SOFTENER 100 MG capsule Take 100 mg by mouth 2 (two) times daily.     tamsulosin (FLOMAX) 0.4 MG CAPS capsule TK 1 C PO QD FOR 2 WEEKS     No current facility-administered medications on file prior to visit.    Past Surgical History:   Procedure Laterality Date   COLONOSCOPY     INCISIONAL HERNIA REPAIR     right and left   TONSILLECTOMY      No Known Allergies  Social History   Socioeconomic History   Marital status: Married    Spouse name: Not on file   Number of children: Not on file   Years of education: Not on file   Highest education level: Not on file  Occupational History   Not on file  Tobacco Use   Smoking status: Never   Smokeless tobacco: Never  Substance and Sexual Activity   Alcohol use: Yes    Alcohol/week: 0.0 standard drinks    Comment: very rare   Drug use: No   Sexual activity: Not on file  Other Topics Concern   Not on file  Social History Narrative   Not on file   Social Determinants of Health   Financial Resource Strain: Not on file  Food Insecurity: Not on file  Transportation Needs: Not on file  Physical Activity: Not on file  Stress: Not on file  Social Connections: Not on file  Intimate Partner Violence: Not on file    History reviewed. No pertinent family history.  BP 122/72   Ht _0  (1.854 m)   Wt 175 lb (79.4 kg)  BMI 23.09 kg/m   [facility] Adult [facility] 09/25/2020  Frequency of aerobic exercise (# of days/week) 3  Average time in minutes 60  Frequency of strengthening activities (# of days/week) 3    No flowsheet data found.  Review of Systems: See HPI above.     Objective:  Physical Exam:  Gen: NAD, comfortable in exam room  Right hip: No deformity. FROM with 5/5 strength. No tenderness to palpation. NVI distally. Negative logroll No pain on resisted leg raise, crunch, oblique crunch.   Assessment & Plan:  1. Right oblique strain - resolved.  Reviewed which exercise to focus on along with his usual crunches, consider adding planks as well.  F/u prn.

## 2021-07-03 DIAGNOSIS — Z23 Encounter for immunization: Secondary | ICD-10-CM | POA: Diagnosis not present

## 2021-07-30 ENCOUNTER — Other Ambulatory Visit: Payer: Self-pay | Admitting: Podiatry

## 2021-07-30 ENCOUNTER — Other Ambulatory Visit: Payer: Self-pay

## 2021-07-30 ENCOUNTER — Ambulatory Visit (INDEPENDENT_AMBULATORY_CARE_PROVIDER_SITE_OTHER): Payer: Medicare Other | Admitting: Podiatry

## 2021-07-30 DIAGNOSIS — L601 Onycholysis: Secondary | ICD-10-CM

## 2021-07-30 DIAGNOSIS — E119 Type 2 diabetes mellitus without complications: Secondary | ICD-10-CM | POA: Diagnosis not present

## 2021-07-30 DIAGNOSIS — B351 Tinea unguium: Secondary | ICD-10-CM

## 2021-07-30 NOTE — Progress Notes (Signed)
  Subjective:  Patient ID: Aaron May, male    DOB: 1941-05-02,  MRN: [id number]  Chief Complaint  Patient presents with   Nail Problem    F/U Lt hallux nail fungus -per pt nail fungus is better jsut thickening of nails is still present - D/C penlac due to being hard to remove tx: none   80 y.o. male presents with the above complaint. History confirmed with patient.   Objective:  Physical Exam: warm, good capillary refill, no trophic changes or ulcerative lesions, normal DP and PT pulses, and normal sensory exam. Left Foot: left hallux nail without continued lysis, yellow discoloration and crumbly texture. Right Foot: right hallux nail, lesser nails dystrophic changes distally with clear growth.   Assessment:   1. Onychomycosis   2. Onycholysis   3. Diabetes mellitus without complication ([place])     Plan:  Patient was evaluated and treated and all questions answered.  Nail dystrophy, onycholysis -Improving, suggesting there is indeed fungal component with traumatic changes. The nails were debrided today. Continue Penlac. F/u in 2 months for recheck.  Return in about 2 months (around 09/29/2021).

## 2021-07-30 NOTE — Telephone Encounter (Signed)
Please advise 

## 2021-07-31 NOTE — Telephone Encounter (Signed)
Please advise 

## 2021-08-30 DIAGNOSIS — J324 Chronic pansinusitis: Secondary | ICD-10-CM | POA: Diagnosis not present

## 2021-08-30 DIAGNOSIS — E7800 Pure hypercholesterolemia, unspecified: Secondary | ICD-10-CM | POA: Diagnosis not present

## 2021-08-30 DIAGNOSIS — J310 Chronic rhinitis: Secondary | ICD-10-CM | POA: Diagnosis not present

## 2021-08-30 DIAGNOSIS — E1169 Type 2 diabetes mellitus with other specified complication: Secondary | ICD-10-CM | POA: Diagnosis not present

## 2021-09-05 DIAGNOSIS — H52203 Unspecified astigmatism, bilateral: Secondary | ICD-10-CM | POA: Diagnosis not present

## 2021-09-05 DIAGNOSIS — E119 Type 2 diabetes mellitus without complications: Secondary | ICD-10-CM | POA: Diagnosis not present

## 2021-09-05 DIAGNOSIS — H40023 Open angle with borderline findings, high risk, bilateral: Secondary | ICD-10-CM | POA: Diagnosis not present

## 2021-09-05 DIAGNOSIS — H2513 Age-related nuclear cataract, bilateral: Secondary | ICD-10-CM | POA: Diagnosis not present

## 2021-09-19 DIAGNOSIS — L812 Freckles: Secondary | ICD-10-CM | POA: Diagnosis not present

## 2021-09-19 DIAGNOSIS — D225 Melanocytic nevi of trunk: Secondary | ICD-10-CM | POA: Diagnosis not present

## 2021-09-19 DIAGNOSIS — L820 Inflamed seborrheic keratosis: Secondary | ICD-10-CM | POA: Diagnosis not present

## 2021-09-19 DIAGNOSIS — L300 Nummular dermatitis: Secondary | ICD-10-CM | POA: Diagnosis not present

## 2021-09-19 DIAGNOSIS — L821 Other seborrheic keratosis: Secondary | ICD-10-CM | POA: Diagnosis not present

## 2021-10-01 ENCOUNTER — Ambulatory Visit (INDEPENDENT_AMBULATORY_CARE_PROVIDER_SITE_OTHER): Payer: Medicare Other | Admitting: Podiatry

## 2021-10-01 ENCOUNTER — Encounter: Payer: Self-pay | Admitting: Podiatry

## 2021-10-01 DIAGNOSIS — L601 Onycholysis: Secondary | ICD-10-CM | POA: Diagnosis not present

## 2021-10-01 DIAGNOSIS — B351 Tinea unguium: Secondary | ICD-10-CM

## 2021-10-01 MED ORDER — CICLOPIROX 8 % EX SOLN
CUTANEOUS | 0 refills | Status: DC
Start: 2021-10-01 — End: 2022-10-30

## 2021-10-01 NOTE — Progress Notes (Signed)
  Subjective:  Patient ID: Aaron May, male    DOB: [birth year],  MRN: [id number]  Chief Complaint  Patient presents with   Nail Problem    I had a board fall on both big toes and the ointment is helping    80 y.o. male presents with the above complaint. History confirmed with patient. Thinks the Penlac is helping. Has not used it for the past week.  Objective:  Physical Exam: warm, good capillary refill, no trophic changes or ulcerative lesions, normal DP and PT pulses, and normal sensory exam. Left Foot: left hallux nail without continued lysis, yellow discoloration and crumbly texture with clear growth. Right Foot: right hallux nail, lesser nails dystrophic changes distally with clear growth.   Assessment:   1. Onychomycosis   2. Onycholysis     Plan:  Patient was evaluated and treated and all questions answered.  Nail dystrophy, onycholysis -Continues to improve, suggesting there is indeed fungal component with traumatic changes. Debrided nails in thickness with burr. Advised to continue use until nails fully grow out. Continue Penlac. F/u as needed.  Return if symptoms worsen or fail to improve.

## 2021-10-15 DIAGNOSIS — H02051 Trichiasis without entropian right upper eyelid: Secondary | ICD-10-CM | POA: Diagnosis not present

## 2021-10-15 DIAGNOSIS — H40013 Open angle with borderline findings, low risk, bilateral: Secondary | ICD-10-CM | POA: Diagnosis not present

## 2021-10-24 DIAGNOSIS — J209 Acute bronchitis, unspecified: Secondary | ICD-10-CM | POA: Diagnosis not present

## 2021-12-03 ENCOUNTER — Ambulatory Visit (INDEPENDENT_AMBULATORY_CARE_PROVIDER_SITE_OTHER): Payer: Medicare Other | Admitting: Podiatry

## 2021-12-03 DIAGNOSIS — Z91199 Patient's noncompliance with other medical treatment and regimen due to unspecified reason: Secondary | ICD-10-CM

## 2021-12-03 NOTE — Progress Notes (Signed)
   Complete physical exam  Patient: Aaron May   DOB: 08/17/1997   81 y.o. Male  MRN: [id number]  Subjective:    No chief complaint on file.   Aaron May is a 81 y.o. male who presents today for a complete physical exam. She reports consuming a {diet types:17450} diet. {types:19826} She generally feels {DESC; WELL/FAIRLY WELL/POORLY:18703}. She reports sleeping {DESC; WELL/FAIRLY WELL/POORLY:18703}. She {does/does not:200015} have additional problems to discuss today.    Most recent fall risk assessment:    04/24/2022   10:42 AM  Fall Risk   Falls in the past year? 0  Number falls in past yr: 0  Injury with Fall? 0  Risk for fall due to : No Fall Risks  Follow up Falls evaluation completed     Most recent depression screenings:    04/24/2022   10:42 AM 03/15/2021   10:46 AM  PHQ 2/9 Scores  PHQ - 2 Score 0 0  PHQ- 9 Score 5     {VISON DENTAL STD PSA (Optional):27386}  {History (Optional):23778}  Patient Care Team: Jessup, Joy, NP as PCP - General (Nurse Practitioner)   Outpatient Medications Prior to Visit  Medication Sig   fluticasone (FLONASE) 50 MCG/ACT nasal spray Place 2 sprays into both nostrils in the morning and at bedtime. After 7 days, reduce to once daily.   norgestimate-ethinyl estradiol (SPRINTEC 28) 0.25-35 MG-MCG tablet Take 1 tablet by mouth daily.   Nystatin POWD Apply liberally to affected area 2 times per day   spironolactone (ALDACTONE) 100 MG tablet Take 1 tablet (100 mg total) by mouth daily.   No facility-administered medications prior to visit.    ROS        Objective:     There were no vitals taken for this visit. {Vitals History (Optional):23777}  Physical Exam   No results found for any visits on 05/30/22. {Show previous labs (optional):23779}    Assessment & Plan:    Routine Health Maintenance and Physical Exam  Immunization History  Administered Date(s) Administered   DTaP 10/31/1999, 12/27/1999,  03/06/2000, 11/20/2000, 06/05/2004   Hepatitis A 04/01/2008, 04/07/2009   Hepatitis B 08/18/1999, 09/25/1999, 03/06/2000   HiB (PRP-OMP) 10/31/1999, 12/27/1999, 03/06/2000, 11/20/2000   IPV 10/31/1999, 12/27/1999, 08/25/2000, 06/05/2004   Influenza,inj,Quad PF,6+ Mos 07/08/2014   Influenza-Unspecified 10/07/2012   MMR 08/25/2001, 06/05/2004   Meningococcal Polysaccharide 04/06/2012   Pneumococcal Conjugate-13 11/20/2000   Pneumococcal-Unspecified 03/06/2000, 05/20/2000   Tdap 04/06/2012   Varicella 08/25/2000, 04/01/2008    Health Maintenance  Topic Date Due   HIV Screening  Never done   Hepatitis C Screening  Never done   INFLUENZA VACCINE  05/28/2022   PAP-Cervical Cytology Screening  05/30/2022 (Originally 08/16/2020)   PAP SMEAR-Modifier  05/30/2022 (Originally 08/16/2020)   TETANUS/TDAP  05/30/2022 (Originally 04/06/2022)   HPV VACCINES  Discontinued   COVID-19 Vaccine  Discontinued    Discussed health benefits of physical activity, and encouraged her to engage in regular exercise appropriate for her age and condition.  Problem List Items Addressed This Visit   None Visit Diagnoses     Annual physical exam    -  Primary   Cervical cancer screening       Need for Tdap vaccination          No follow-ups on file.     Joy Jessup, NP   

## 2021-12-17 ENCOUNTER — Ambulatory Visit: Payer: Medicare Other | Admitting: Podiatry

## 2021-12-21 ENCOUNTER — Ambulatory Visit (INDEPENDENT_AMBULATORY_CARE_PROVIDER_SITE_OTHER): Payer: Medicare Other | Admitting: Sports Medicine

## 2021-12-21 ENCOUNTER — Encounter: Payer: Self-pay | Admitting: Sports Medicine

## 2021-12-21 DIAGNOSIS — E119 Type 2 diabetes mellitus without complications: Secondary | ICD-10-CM | POA: Diagnosis not present

## 2021-12-21 DIAGNOSIS — L601 Onycholysis: Secondary | ICD-10-CM | POA: Diagnosis not present

## 2021-12-21 DIAGNOSIS — B351 Tinea unguium: Secondary | ICD-10-CM

## 2021-12-21 NOTE — Patient Instructions (Signed)
Vinegar soaks 1 cup of white distilled vinegar to 8 cups of warm water.  Soak 20 mins. May repeat soak two times per week.  If there is thickness to nails may file nails after soaks or after bath/shower with nail file and apply vicks vapor rub. Apply rub daily to nails after filing for the best result.

## 2021-12-21 NOTE — Progress Notes (Signed)
Subjective: Aaron May is a 81 y.o. male patient with history of diabetes who presents to office for evaluation of fungal nails.  Patient reports that he has been coming in periodically every few months to have his nails checked and states that he did try a topical solution that Dr. March Rummage had prescribed however this solution tends to leave a very thick for him and he cannot tell if it is really helping or doing anything.  Patient is diabetic blood sugar not recorded this visit but does state that a few years back he did have an injury where a board fell across his toes and ever since then the big toenails became very thick and started to grow this way.  Patient denies any current symptoms of redness warmth swelling or drainage to the affected toenails.  Patient Active Problem List   Diagnosis Date Noted   Muscle strain 04/11/2021   Right hip pain 05/21/2017   Right knee pain 12/28/2015   Trapezius strain 12/04/2015   Current Outpatient Medications on File Prior to Visit  Medication Sig Dispense Refill   aspirin 81 MG tablet Take 81 mg by mouth daily.     BINAXNOW COVID-19 AG HOME TEST KIT TEST AS DIRECTED TODAY     ciclopirox (PENLAC) 8 % solution APPLY EVERY NIGHT AT BEDTIME OVER NAIL AND SURROUNDING SKIN. APPLY OVER PREVIOUS COAT. REMOVE WEEKLY WITH FILE OR POLISH REMOVER 6.6 mL 0   CONTOUR TEST test strip TEST BLOOD SUGAR UP TO QID AS DIRECTED     ipratropium (ATROVENT) 0.06 % nasal spray SMARTSIG:1 Spray(s) Both Nares 3 Times Daily PRN     metFORMIN (GLUCOPHAGE) 1000 MG tablet Take 1 tablet by mouth 2 (two) times daily.     pioglitazone (ACTOS) 30 MG tablet Take 30 mg by mouth daily.     predniSONE (DELTASONE) 20 MG tablet Take by mouth.     STOOL SOFTENER 100 MG capsule Take 100 mg by mouth 2 (two) times daily.     tamsulosin (FLOMAX) 0.4 MG CAPS capsule TK 1 C PO QD FOR 2 WEEKS     triamcinolone cream (KENALOG) 0.1 % SMARTSIG:1 Gram(s) Topical Twice Daily PRN     No current  facility-administered medications on file prior to visit.   No Known Allergies  No results found for this or any previous visit (from the past 2160 hour(s)).  Objective: General: Patient is awake, alert, and oriented x 3 and in no acute distress.  Integument: Skin is warm, dry and supple bilateral. Nails are tender, long, thickened and dystrophic with subungual debris, consistent with onychomycosis, bilateral hallux.  There are no signs of acute infection. No open lesions or preulcerative lesions present bilateral. Remaining integument unremarkable.  Vasculature:  Dorsalis Pedis pulse 1/4 bilateral. Posterior Tibial pulse 1/4 bilateral. Capillary fill time <3 sec 1-5 bilateral.  Varicosities noted bilateral.  Neurology: Gross sensation present via light touch bilateral.  Musculoskeletal: Asymptomatic hammertoe pedal deformities noted bilateral.   Assessment and Plan: Problem List Items Addressed This Visit   None Visit Diagnoses     Onychomycosis    -  Primary   Onycholysis       Diabetes mellitus without complication ([place])           -Examined patient. -Discussed and educated patient on diabetic foot care, especially with  regards to the vascular, neurological and musculoskeletal systems.  -Discussed with patient thickness to both big toenails which is likely consistent with fungus advised patient if he feels  like the Penlac solution is not helping he may discontinue it and try soaking with vinegar and using Vicks VapoRub to see if this will continue to help his toenails -Answered all patient questions son visit -Patient to return  in 3 months for at risk foot care, diabetic trim if needed and nail fungus check -Patient advised to call the office if any problems or questions arise in the meantime.  Landis Martins, DPM

## 2021-12-28 DIAGNOSIS — N401 Enlarged prostate with lower urinary tract symptoms: Secondary | ICD-10-CM | POA: Diagnosis not present

## 2022-01-22 DIAGNOSIS — K644 Residual hemorrhoidal skin tags: Secondary | ICD-10-CM | POA: Diagnosis not present

## 2022-03-05 DIAGNOSIS — E1169 Type 2 diabetes mellitus with other specified complication: Secondary | ICD-10-CM | POA: Diagnosis not present

## 2022-03-05 DIAGNOSIS — E7800 Pure hypercholesterolemia, unspecified: Secondary | ICD-10-CM | POA: Diagnosis not present

## 2022-03-05 DIAGNOSIS — E785 Hyperlipidemia, unspecified: Secondary | ICD-10-CM | POA: Diagnosis not present

## 2022-03-12 DIAGNOSIS — E113299 Type 2 diabetes mellitus with mild nonproliferative diabetic retinopathy without macular edema, unspecified eye: Secondary | ICD-10-CM | POA: Diagnosis not present

## 2022-03-12 DIAGNOSIS — G63 Polyneuropathy in diseases classified elsewhere: Secondary | ICD-10-CM | POA: Diagnosis not present

## 2022-03-12 DIAGNOSIS — E1169 Type 2 diabetes mellitus with other specified complication: Secondary | ICD-10-CM | POA: Diagnosis not present

## 2022-03-12 DIAGNOSIS — E7800 Pure hypercholesterolemia, unspecified: Secondary | ICD-10-CM | POA: Diagnosis not present

## 2022-03-12 DIAGNOSIS — Z0000 Encounter for general adult medical examination without abnormal findings: Secondary | ICD-10-CM | POA: Diagnosis not present

## 2022-03-13 ENCOUNTER — Encounter: Payer: Self-pay | Admitting: Sports Medicine

## 2022-03-13 ENCOUNTER — Ambulatory Visit (INDEPENDENT_AMBULATORY_CARE_PROVIDER_SITE_OTHER): Payer: Medicare Other | Admitting: Sports Medicine

## 2022-03-13 DIAGNOSIS — E119 Type 2 diabetes mellitus without complications: Secondary | ICD-10-CM | POA: Diagnosis not present

## 2022-03-13 DIAGNOSIS — M79675 Pain in left toe(s): Secondary | ICD-10-CM

## 2022-03-13 DIAGNOSIS — M79674 Pain in right toe(s): Secondary | ICD-10-CM

## 2022-03-13 DIAGNOSIS — B351 Tinea unguium: Secondary | ICD-10-CM

## 2022-03-13 NOTE — Progress Notes (Signed)
Subjective: ?Aaron May is a 81 y.o. male patient with history of diabetes who returns to office for evaluation of fungal nails.  Patient reports that he thinks that his nail is lifting up.  Patient denies any current symptoms of redness warmth swelling or drainage to the affected toenails. States that he has been using vicks and soaking using vinegar. No other issues noted.  ? ?Patient Active Problem List  ? Diagnosis Date Noted  ? Muscle strain 04/11/2021  ? Right hip pain 05/21/2017  ? Right knee pain 12/28/2015  ? Trapezius strain 12/04/2015  ? ?Current Outpatient Medications on File Prior to Visit  ?Medication Sig Dispense Refill  ? aspirin 81 MG tablet Take 81 mg by mouth daily.    ? BINAXNOW COVID-19 AG HOME TEST KIT TEST AS DIRECTED TODAY    ? ciclopirox (PENLAC) 8 % solution APPLY EVERY NIGHT AT BEDTIME OVER NAIL AND SURROUNDING SKIN. APPLY OVER PREVIOUS COAT. REMOVE WEEKLY WITH FILE OR POLISH REMOVER 6.6 mL 0  ? CONTOUR TEST test strip TEST BLOOD SUGAR UP TO QID AS DIRECTED    ? ipratropium (ATROVENT) 0.06 % nasal spray SMARTSIG:1 Spray(s) Both Nares 3 Times Daily PRN    ? metFORMIN (GLUCOPHAGE) 1000 MG tablet Take 1 tablet by mouth 2 (two) times daily.    ? pioglitazone (ACTOS) 30 MG tablet Take 30 mg by mouth daily.    ? predniSONE (DELTASONE) 20 MG tablet Take by mouth.    ? STOOL SOFTENER 100 MG capsule Take 100 mg by mouth 2 (two) times daily.    ? tadalafil (CIALIS) 5 MG tablet Take 5 mg by mouth daily.    ? tamsulosin (FLOMAX) 0.4 MG CAPS capsule TK 1 C PO QD FOR 2 WEEKS    ? triamcinolone cream (KENALOG) 0.1 % SMARTSIG:1 Gram(s) Topical Twice Daily PRN    ? ?No current facility-administered medications on file prior to visit.  ? ?No Known Allergies ? ?No results found for this or any previous visit (from the past 2160 hour(s)). ? ?Objective: ?General: Patient is awake, alert, and oriented x 3 and in no acute distress. ? ?Integument: Skin is warm, dry and supple bilateral. Nails are tender,  long, thickened and dystrophic with subungual debris, consistent with onychomycosis, bilateral hallux and 2,3 nails on right.  There are no signs of acute infection. No open lesions or preulcerative lesions present bilateral. Remaining integument unremarkable. ? ?Vasculature:  Dorsalis Pedis pulse 1/4 bilateral. Posterior Tibial pulse 1/4 bilateral. Capillary fill time <3 sec 1-5 bilateral.  Varicosities noted bilateral. ? ?Neurology: Gross sensation present via light touch bilateral. ? ?Musculoskeletal: Asymptomatic hammertoe pedal deformities noted bilateral.  ? ?Assessment and Plan: ?Problem List Items Addressed This Visit   ?None ?Visit Diagnoses   ? ? Pain due to onychomycosis of toenails of both feet    -  Primary  ? Diabetes mellitus without complication ([place])      ? ?  ? ? ?-Examined patient. ?-Discussed treatment options for nail fungus ?-Advised patient to continue using vinegar and using Vicks VapoRub to see if this will continue to help his toenails if no improvement may consider laser ?-Answered all patient questions son visit ?-Patient to return  in 3 months for at risk foot care, diabetic trim if needed and nail fungus check next visit ?-Patient advised to call the office if any problems or questions arise in the meantime. ? ?Landis Martins, DPM ?

## 2022-04-16 DIAGNOSIS — N401 Enlarged prostate with lower urinary tract symptoms: Secondary | ICD-10-CM | POA: Diagnosis not present

## 2022-05-21 ENCOUNTER — Ambulatory Visit: Payer: Medicare Other | Admitting: Podiatry

## 2022-05-21 DIAGNOSIS — K648 Other hemorrhoids: Secondary | ICD-10-CM | POA: Diagnosis not present

## 2022-05-22 ENCOUNTER — Ambulatory Visit (INDEPENDENT_AMBULATORY_CARE_PROVIDER_SITE_OTHER): Payer: Medicare Other | Admitting: Podiatry

## 2022-05-22 ENCOUNTER — Encounter: Payer: Self-pay | Admitting: Podiatry

## 2022-05-22 DIAGNOSIS — E119 Type 2 diabetes mellitus without complications: Secondary | ICD-10-CM | POA: Diagnosis not present

## 2022-05-22 DIAGNOSIS — L600 Ingrowing nail: Secondary | ICD-10-CM | POA: Diagnosis not present

## 2022-05-22 DIAGNOSIS — B351 Tinea unguium: Secondary | ICD-10-CM | POA: Diagnosis not present

## 2022-05-22 NOTE — Progress Notes (Signed)
  Subjective:  Patient ID: Aaron May, male    DOB: 24-Jan-1941,   MRN: [id number]  Chief Complaint  Patient presents with   Nail Problem    Bilateral great toe pain , patient is concerned about the nail. Patient states left great toe nail was suppose to be gone and right great toe nail looks like its coming off    81 y.o. male presents for concern of bilateral hallux thickened painful toenail that is now ingrowing. Requesting to have it removed.  Denies any other pedal complaints. Denies n/v/f/c.   Past Medical History:  Diagnosis Date   Diabetes mellitus without complication ([facility])    Snores    Wears glasses     Objective:  Physical Exam: Vascular: DP/PT pulses 2/4 bilateral. CFT <3 seconds. Normal hair growth on digits. No edema.  Skin. No lacerations or abrasions bilateral feet. Left hallux nail is thickened discolored and incurvated. No erythema edema or purulence noted.  Musculoskeletal: MMT 5/5 bilateral lower extremities in DF, PF, Inversion and Eversion. Deceased ROM in DF of ankle joint.  Neurological: Sensation intact to light touch.   Assessment:   1. Onychomycosis   2. Ingrown left greater toenail   3. Diabetes mellitus without complication ([place])      Plan:  Patient was evaluated and treated and all questions answered. Patient requesting removal of ingrown nail today. Procedure below.  Discussed procedure and post procedure care and patient expressed understanding.  Will follow-up in 2 weeks for nail check or sooner if any problems arise.    Procedure:  Procedure: total Nail Avulsion of left hallux nail Surgeon: Lorenda Peck, DPM  Pre-op Dx: Ingrown toenail without infection Post-op: Same  Place of Surgery: Office exam room.  Indications for surgery: Painful and ingrown toenail.    The patient is requesting removal of nail without chemical matrixectomy. Risks and complications were discussed with the patient for which they understand and written  consent was obtained. Under sterile conditions a total of 3 mL of  1% lidocaine plain was infiltrated in a hallux block fashion. Once anesthetized, the skin was prepped in sterile fashion. A tourniquet was then applied. Next the entire left hallux nail was removed and area copiously irrigated. Silvadene was applied. A dry sterile dressing was applied. After application of the dressing the tourniquet was removed and there is found to be an immediate capillary refill time to the digit. The patient tolerated the procedure well without any complications. Post procedure instructions were discussed the patient for which he verbally understood. Follow-up in two weeks for nail check or sooner if any problems are to arise. Discussed signs/symptoms of infection and directed to call the office immediately should any occur or go directly to the emergency room. In the meantime, encouraged to call the office with any questions, concerns, changes symptoms.   Lorenda Peck, DPM

## 2022-05-22 NOTE — Patient Instructions (Signed)

## 2022-06-10 ENCOUNTER — Encounter: Payer: Self-pay | Admitting: Podiatry

## 2022-06-10 ENCOUNTER — Ambulatory Visit (INDEPENDENT_AMBULATORY_CARE_PROVIDER_SITE_OTHER): Payer: Medicare Other | Admitting: Podiatry

## 2022-06-10 DIAGNOSIS — M79674 Pain in right toe(s): Secondary | ICD-10-CM

## 2022-06-10 DIAGNOSIS — L600 Ingrowing nail: Secondary | ICD-10-CM

## 2022-06-10 DIAGNOSIS — B351 Tinea unguium: Secondary | ICD-10-CM | POA: Diagnosis not present

## 2022-06-10 DIAGNOSIS — M79675 Pain in left toe(s): Secondary | ICD-10-CM

## 2022-06-10 DIAGNOSIS — E119 Type 2 diabetes mellitus without complications: Secondary | ICD-10-CM | POA: Diagnosis not present

## 2022-06-10 NOTE — Progress Notes (Signed)
  Subjective:  Patient ID: Aaron May, male    DOB: [birth year],   MRN: [id number]  No chief complaint on file.   81 y.o. male presents for follow-up of left great toenail removal. Relates it is doing well without any pain. Has been soaking and using neosporin and band aid. Also with concern of thickened elongated and painful nails that are difficult to trim. Requesting to have them trimmed today. Relates burning and tingling in their feet occasionally.  Patient is diabetic and last A1c was No results found for: "HGBA1C" .   PCP:  Street, Sharon Mt, MD   Denies any other pedal complaints. Denies n/v/f/c.   Past Medical History:  Diagnosis Date   Diabetes mellitus without complication (H[facility])    Snores    Wears glasses     Objective:  Physical Exam: Vascular: DP/PT pulses 2/4 bilateral. CFT <3 seconds. Normal hair growth on digits. No edema.  Skin. No lacerations or abrasions bilateral feet. Left hallux nail bed well healed. Remaining nails are thickened elongated and with subungual debris.  Musculoskeletal: MMT 5/5 bilateral lower extremities in DF, PF, Inversion and Eversion. Deceased ROM in DF of ankle joint.  Neurological: Sensation intact to light touch.   Assessment:   1. Onychomycosis   2. Ingrown left greater toenail   3. Diabetes mellitus without complication ([place])   4. Pain due to onychomycosis of toenails of both feet      Plan:  Patient was evaluated and treated and all questions answered. Toe was evaluated and appears to be healing well.  May discontinue soaks and neosporin.  -Discussed and educated patient on diabetic foot care, especially with  regards to the vascular, neurological and musculoskeletal systems.  -Stressed the importance of good glycemic control and the detriment of not  controlling glucose levels in relation to the foot. -Discussed supportive shoes at all times and checking feet regularly.  -Mechanically debrided all nails 1-5 bilateral  using sterile nail nipper and filed with dremel without incident  -Answered all patient questions -Patient to return  in 3 months for at risk foot care -Patient advised to call the office if any problems or questions arise in the meantime.    Lorenda Peck, DPM

## 2022-07-09 DIAGNOSIS — Z23 Encounter for immunization: Secondary | ICD-10-CM | POA: Diagnosis not present

## 2022-07-17 DIAGNOSIS — Z23 Encounter for immunization: Secondary | ICD-10-CM | POA: Diagnosis not present

## 2022-08-14 DIAGNOSIS — L821 Other seborrheic keratosis: Secondary | ICD-10-CM | POA: Diagnosis not present

## 2022-08-14 DIAGNOSIS — D225 Melanocytic nevi of trunk: Secondary | ICD-10-CM | POA: Diagnosis not present

## 2022-08-14 DIAGNOSIS — D2262 Melanocytic nevi of left upper limb, including shoulder: Secondary | ICD-10-CM | POA: Diagnosis not present

## 2022-08-14 DIAGNOSIS — L820 Inflamed seborrheic keratosis: Secondary | ICD-10-CM | POA: Diagnosis not present

## 2022-08-21 DIAGNOSIS — H2513 Age-related nuclear cataract, bilateral: Secondary | ICD-10-CM | POA: Diagnosis not present

## 2022-08-21 DIAGNOSIS — E119 Type 2 diabetes mellitus without complications: Secondary | ICD-10-CM | POA: Diagnosis not present

## 2022-08-21 DIAGNOSIS — H40013 Open angle with borderline findings, low risk, bilateral: Secondary | ICD-10-CM | POA: Diagnosis not present

## 2022-08-21 DIAGNOSIS — H5203 Hypermetropia, bilateral: Secondary | ICD-10-CM | POA: Diagnosis not present

## 2022-08-29 DIAGNOSIS — B9789 Other viral agents as the cause of diseases classified elsewhere: Secondary | ICD-10-CM | POA: Diagnosis not present

## 2022-08-29 DIAGNOSIS — R509 Fever, unspecified: Secondary | ICD-10-CM | POA: Diagnosis not present

## 2022-08-29 DIAGNOSIS — R051 Acute cough: Secondary | ICD-10-CM | POA: Diagnosis not present

## 2022-08-29 DIAGNOSIS — R519 Headache, unspecified: Secondary | ICD-10-CM | POA: Diagnosis not present

## 2022-08-29 DIAGNOSIS — R0981 Nasal congestion: Secondary | ICD-10-CM | POA: Diagnosis not present

## 2022-09-09 ENCOUNTER — Ambulatory Visit (INDEPENDENT_AMBULATORY_CARE_PROVIDER_SITE_OTHER): Payer: Self-pay | Admitting: Podiatry

## 2022-09-09 DIAGNOSIS — Z91199 Patient's noncompliance with other medical treatment and regimen due to unspecified reason: Secondary | ICD-10-CM

## 2022-09-09 NOTE — Progress Notes (Signed)
Pt was a no show for appointment, charge generated

## 2022-09-10 ENCOUNTER — Ambulatory Visit: Payer: Medicare Other | Admitting: Family Medicine

## 2022-09-13 ENCOUNTER — Ambulatory Visit (INDEPENDENT_AMBULATORY_CARE_PROVIDER_SITE_OTHER): Payer: Medicare Other | Admitting: Podiatry

## 2022-09-13 DIAGNOSIS — M79674 Pain in right toe(s): Secondary | ICD-10-CM | POA: Diagnosis not present

## 2022-09-13 DIAGNOSIS — E119 Type 2 diabetes mellitus without complications: Secondary | ICD-10-CM

## 2022-09-13 DIAGNOSIS — M79675 Pain in left toe(s): Secondary | ICD-10-CM | POA: Diagnosis not present

## 2022-09-13 DIAGNOSIS — B351 Tinea unguium: Secondary | ICD-10-CM | POA: Diagnosis not present

## 2022-09-13 NOTE — Progress Notes (Signed)
  Subjective:  Patient ID: Aaron May, male    DOB: [birth year],  MRN: [id number]  Chief Complaint  Patient presents with   Nail Problem    DFC- Blood sugar this morning was 180s.     81 y.o. male presents with the above complaint. History confirmed with patient. Patient presenting with pain related to dystrophic thickened elongated nails. Patient is unable to trim own nails related to nail dystrophy and/or mobility issues. Patient does have a history of T2DM. Also concerned about nail discoloration and fungal infection. Previously had left hallux nail removed.   Objective:  Physical Exam: warm, good capillary refill nail exam onychomycosis of the toenails, onycholysis, and dystrophic nails DP pulses palpable, PT pulses palpable, and protective sensation intact Left Foot:  Pain with palpation of nails due to elongation and dystrophic growth.  Hallux nail is growing back in with decreased fungal thickening from prior Right Foot: Pain with palpation of nails due to elongation and dystrophic growth.  Right hallux nail has significant fungal dystrophic growth.  Assessment:   1. Pain due to onychomycosis of toenails of both feet   2. Onychomycosis   3. Diabetes mellitus without complication ([place])      Plan:  Patient was evaluated and treated and all questions answered.  #Onychomycosis with pain  -Nails palliatively debrided as below. -Discussed treatment options for onychomycosis including oral and topical antifungal medications. -Patient states he has some topical antifungal.  He would like to defer oral antifungal due to risk of liver toxicity at this time. -We will proceed with plan for ongoing diabetic foot care and nail care every 3 months basis. -Discussed that we can consider removing the right hallux nail if it continues to bother him in the future. -Educated on self-care  Procedure: Nail Debridement Rationale: Pain Type of Debridement: manual, sharp  debridement. Instrumentation: Nail nipper, rotary burr. Number of Nails: 10  Return in about 3 months (around 12/14/2022) for [facility].         Everitt Amber, DPM [facility]

## 2022-10-04 DIAGNOSIS — Z79899 Other long term (current) drug therapy: Secondary | ICD-10-CM | POA: Diagnosis not present

## 2022-10-04 DIAGNOSIS — E1169 Type 2 diabetes mellitus with other specified complication: Secondary | ICD-10-CM | POA: Diagnosis not present

## 2022-10-04 DIAGNOSIS — E785 Hyperlipidemia, unspecified: Secondary | ICD-10-CM | POA: Diagnosis not present

## 2022-10-04 DIAGNOSIS — E7800 Pure hypercholesterolemia, unspecified: Secondary | ICD-10-CM | POA: Diagnosis not present

## 2022-10-04 DIAGNOSIS — G63 Polyneuropathy in diseases classified elsewhere: Secondary | ICD-10-CM | POA: Diagnosis not present

## 2022-10-04 DIAGNOSIS — E113299 Type 2 diabetes mellitus with mild nonproliferative diabetic retinopathy without macular edema, unspecified eye: Secondary | ICD-10-CM | POA: Diagnosis not present

## 2022-10-15 DIAGNOSIS — R351 Nocturia: Secondary | ICD-10-CM | POA: Diagnosis not present

## 2022-10-15 DIAGNOSIS — N401 Enlarged prostate with lower urinary tract symptoms: Secondary | ICD-10-CM | POA: Diagnosis not present

## 2022-10-30 ENCOUNTER — Encounter: Payer: Self-pay | Admitting: Allergy and Immunology

## 2022-10-30 ENCOUNTER — Ambulatory Visit (INDEPENDENT_AMBULATORY_CARE_PROVIDER_SITE_OTHER): Payer: Medicare Other | Admitting: Allergy and Immunology

## 2022-10-30 VITALS — BP 112/60 | HR 71 | Resp 16 | Ht 73.0 in | Wt 187.4 lb

## 2022-10-30 DIAGNOSIS — J340 Abscess, furuncle and carbuncle of nose: Secondary | ICD-10-CM

## 2022-10-30 DIAGNOSIS — J3089 Other allergic rhinitis: Secondary | ICD-10-CM

## 2022-10-30 DIAGNOSIS — R040 Epistaxis: Secondary | ICD-10-CM | POA: Diagnosis not present

## 2022-10-30 DIAGNOSIS — K219 Gastro-esophageal reflux disease without esophagitis: Secondary | ICD-10-CM

## 2022-10-30 DIAGNOSIS — J301 Allergic rhinitis due to pollen: Secondary | ICD-10-CM

## 2022-10-30 MED ORDER — MUPIROCIN 2 % EX OINT: TOPICAL_OINTMENT | CUTANEOUS | 0 refills | Status: DC

## 2022-10-30 MED ORDER — RYALTRIS 665-25 MCG/ACT NA SUSP: NASAL | 5 refills | Status: AC

## 2022-10-30 MED ORDER — OMEPRAZOLE 40 MG PO CPDR: 40.0000 mg | DELAYED_RELEASE_CAPSULE | Freq: Every day | ORAL | 5 refills | Status: DC

## 2022-10-30 MED ORDER — FAMOTIDINE 40 MG PO TABS: 40.0000 mg | ORAL_TABLET | Freq: Every day | ORAL | 5 refills | Status: AC

## 2022-10-30 MED ORDER — MONTELUKAST SODIUM 10 MG PO TABS: 10.0000 mg | ORAL_TABLET | Freq: Every day | ORAL | 5 refills | Status: DC

## 2022-10-30 NOTE — Progress Notes (Unsigned)
[place] - [place] - [place] - [place] - [place]   Dear Street,  Thank you for referring Aaron May to the [facility] on 10/30/2022.   Below is a summation of this patient's evaluation and recommendations.  Thank you for your referral. I will keep you informed about this patient's response to treatment.   If you have any questions please do not hesitate to contact me.   Sincerely,  Jiles Prows, MD Allergy / Immunology [place]   ______________________________________________________________________    NEW PATIENT NOTE  Referring Provider: Street, Sharon Mt, * Primary Provider: Street, Sharon Mt, MD Date of office visit: 10/30/2022    Subjective:   Chief Complaint:  Aaron May (DOB: [birth year]) is a 82 y.o. male who presents to the clinic on 10/30/2022 with a chief complaint of Sinus Problem .     HPI: [facility] not presents to this clinic in evaluation of drainage.  Apparently he has a very long history of having drainage in his throat with throat clearing and a tickle in his throat and intermittent sore throat.  He also appears to have some sneezing and nasal congestion without any anosmia without any obvious trigger which has been a longstanding issue of many years duration.  When he was a child he remembers getting skin tested and thinking that he was allergic to feathers.  Most recently he used his wife's ipratropium nasal spray and he developed right epistaxis.  He has burping but no other signs of reflux disease.  He drinks a coffee a day of soda per day and has chocolate a few times per week.  He states that he had COVID in November 2023 treated with Paxlovid and he also required an antibiotic for a prolonged respiratory tract event which fortunately resolved.  He has received the flu vaccine and the COVID-vaccine and the RSV vaccine.  Past  Medical History:  Diagnosis Date   Diabetes mellitus without complication ([place])    Snores    Wears glasses     Past Surgical History:  Procedure Laterality Date   COLONOSCOPY     INCISIONAL HERNIA REPAIR     right and left   TONSILLECTOMY      Allergies as of 10/30/2022   No Known Allergies      Medication List    Contour Test test strip Generic drug: glucose blood TEST BLOOD SUGAR UP TO QID AS DIRECTED   ipratropium 0.06 % nasal spray Commonly known as: ATROVENT SMARTSIG:1 Spray(s) Both Nares 3 Times Daily PRN   metFORMIN 1000 MG tablet Commonly known as: GLUCOPHAGE Take 1 tablet by mouth 2 (two) times daily.   pioglitazone 30 MG tablet Commonly known as: ACTOS Take 30 mg by mouth daily.   tadalafil 5 MG tablet Commonly known as: CIALIS Take 5 mg by mouth daily.    Review of systems negative except as noted in HPI / PMHx or noted below:  Review of Systems  Constitutional: Negative.   HENT: Negative.    Eyes: Negative.   Respiratory: Negative.    Cardiovascular: Negative.   Gastrointestinal: Negative.   Genitourinary: Negative.   Musculoskeletal: Negative.   Skin: Negative.   Neurological: Negative.   Endo/Heme/Allergies: Negative.   Psychiatric/Behavioral: Negative.      History reviewed. No pertinent family history.  Social History   Socioeconomic History   Marital status: Married    Spouse name: Not on  file   Number of children: Not on file   Years of education: Not on file   Highest education level: Not on file  Occupational History   Not on file  Tobacco Use   Smoking status: Never   Smokeless tobacco: Never  Substance and Sexual Activity   Alcohol use: Yes    Alcohol/week: 0.0 standard drinks of alcohol    Comment: very rare   Drug use: No   Sexual activity: Not on file  Other Topics Concern   Not on file  Social History Narrative   Not on file   Social Determinants of Health   Financial Resource Strain: Not on file  Food  Insecurity: Not on file  Transportation Needs: Not on file  Physical Activity: Not on file  Stress: Not on file  Social Connections: Not on file  Intimate Partner Violence: Not on file    Environmental and Social history  Lives in a house with a dry environment, no animals located inside the household, carpet in the bedroom, plastic on the bed, no plastic on the pillow, no smoking ongoing with inside the household. Objective:   Vitals:   10/30/22 1407  BP: 112/60  Pulse: 71  Resp: 16  SpO2: 95%   Height: '6\' 1"'$  (185.4 cm) Weight: 187 lb 6.4 oz (85 kg)  Physical Exam Constitutional:      Appearance: He is not diaphoretic.  HENT:     Head: Normocephalic.     Right Ear: Tympanic membrane, ear canal and external ear normal.     Left Ear: Tympanic membrane, ear canal and external ear normal.     Nose: Mucosal edema (Superficial septal ulcer right) present. No rhinorrhea.     Mouth/Throat:     Pharynx: Uvula midline. No oropharyngeal exudate.  Eyes:     Conjunctiva/sclera: Conjunctivae normal.  Neck:     Thyroid: No thyromegaly.     Trachea: Trachea normal. No tracheal tenderness or tracheal deviation.  Cardiovascular:     Rate and Rhythm: Normal rate and regular rhythm.     Heart sounds: Normal heart sounds, S1 normal and S2 normal. No murmur heard. Pulmonary:     Effort: No respiratory distress.     Breath sounds: Normal breath sounds. No stridor. No wheezing or rales.  Lymphadenopathy:     Head:     Right side of head: No tonsillar adenopathy.     Left side of head: No tonsillar adenopathy.     Cervical: No cervical adenopathy.  Skin:    Findings: No erythema or rash.     Nails: There is no clubbing.  Neurological:     Mental Status: He is alert.     Diagnostics: Allergy skin tests were performed.   Assessment and Plan:    No diagnosis found.  Patient Instructions   1.  Allergen avoidance measures  2.  Treat right septal ulcer with saline followed by  Bactroban ointment 3 times a day for 10 days  3.  Treat inflammation of upper airway after treatment of septal ulcer:   A. Ryaltris - 2 sprays each nostril 1-2 times per day  B. Montelukast 10 mg - 1 tablet 1 time per day  4.  Treat reflux/LPR:   A. Decrease caffeine, chocolate consumption  B. Omeprazole 40 mg - 1 tablet in AM  C. Famotidine 40 mg - 1 tablet in PM  5. Return to clinic in 4 weeks or earlier if problem.    Jiles Prows,  MD Allergy / Immunology [facility]

## 2022-10-30 NOTE — Patient Instructions (Addendum)
  1.  Allergen avoidance measures  2.  Treat right septal ulcer with saline followed by Bactroban ointment 3 times a day for 10 days  3.  Treat inflammation of upper airway after treatment of septal ulcer:   A. Ryaltris - 2 sprays each nostril 1-2 times per day  B. Montelukast 10 mg - 1 tablet 1 time per day  4.  Treat reflux/LPR:   A. Decrease caffeine, chocolate consumption  B. Omeprazole 40 mg - 1 tablet in AM  C. Famotidine 40 mg - 1 tablet in PM  5. Return to clinic in 4 weeks or earlier if problem.

## 2022-10-31 ENCOUNTER — Encounter: Payer: Self-pay | Admitting: Allergy and Immunology

## 2022-11-27 ENCOUNTER — Encounter: Payer: Self-pay | Admitting: Allergy and Immunology

## 2022-11-27 ENCOUNTER — Ambulatory Visit (INDEPENDENT_AMBULATORY_CARE_PROVIDER_SITE_OTHER): Payer: Medicare Other | Admitting: Allergy and Immunology

## 2022-11-27 VITALS — BP 126/74 | HR 78 | Resp 16

## 2022-11-27 DIAGNOSIS — J3089 Other allergic rhinitis: Secondary | ICD-10-CM

## 2022-11-27 DIAGNOSIS — J301 Allergic rhinitis due to pollen: Secondary | ICD-10-CM

## 2022-11-27 DIAGNOSIS — K219 Gastro-esophageal reflux disease without esophagitis: Secondary | ICD-10-CM

## 2022-11-27 NOTE — Progress Notes (Unsigned)
[place] - [place] - [place]   Follow-up Note  Referring Provider: Street, Sharon Mt, * Primary Provider: Street, Sharon Mt, MD Date of Office Visit: 11/27/2022  Subjective:   Aaron May (DOB: Mar 11, 1941) is a 82 y.o. male who returns to the [facility] on 11/27/2022 in re-evaluation of the following:  HPI: Aaron May returns to this clinic in evaluation of allergic rhinitis, LPR, and a superficial nasal septal ulceration.  I last saw him in his neck during his initial evaluation of 30 October 2022.  He has really noticed a very big improvement regarding all of his respiratory tract symptoms since he started medical therapy during his last visit.  He has resolved the drain is in his throat and his throat clearing and a tickle in his throat and sneezing and nasal congestion and overall is very pleased with the response that he is received.  He has performed house dust mite avoidance measures.  The bleeding from his nostril has stopped.  He has completed his 10 days of topical Bactroban in his right nostril.  Allergies as of 11/27/2022   No Known Allergies      Medication List    Contour Test test strip Generic drug: glucose blood TEST BLOOD SUGAR UP TO QID AS DIRECTED   famotidine 40 MG tablet Commonly known as: PEPCID Take 1 tablet (40 mg total) by mouth daily.   metFORMIN 1000 MG tablet Commonly known as: GLUCOPHAGE Take 1 tablet by mouth 2 (two) times daily.   montelukast 10 MG tablet Commonly known as: SINGULAIR Take 1 tablet (10 mg total) by mouth at bedtime.   mupirocin ointment 2 % Commonly known as: BACTROBAN saline followed by Bactroban ointment 3 times a day for 10 days   omeprazole 40 MG capsule Commonly known as: PRILOSEC Take 1 capsule (40 mg total) by mouth daily.   pioglitazone 30 MG tablet Commonly known as: ACTOS Take 30 mg by mouth daily.   Ryaltris 712-45 MCG/ACT Susp Generic drug:  Olopatadine-Mometasone 2 sprays each nostril 1-2 times per day   tadalafil 5 MG tablet Commonly known as: CIALIS Take 5 mg by mouth daily.   tamsulosin 0.4 MG Caps capsule Commonly known as: FLOMAX Take 0.4 mg by mouth daily.    Past Medical History:  Diagnosis Date   Allergic rhinitis    Diabetes mellitus without complication ([place])    Snores    Wears glasses     Past Surgical History:  Procedure Laterality Date   COLONOSCOPY     INCISIONAL HERNIA REPAIR     right and left   TONSILLECTOMY      Review of systems negative except as noted in HPI / PMHx or noted below:  Review of Systems  Constitutional: Negative.   HENT: Negative.    Eyes: Negative.   Respiratory: Negative.    Cardiovascular: Negative.   Gastrointestinal: Negative.   Genitourinary: Negative.   Musculoskeletal: Negative.   Skin: Negative.   Neurological: Negative.   Endo/Heme/Allergies: Negative.   Psychiatric/Behavioral: Negative.       Objective:   Vitals:   11/27/22 1545  BP: 126/74  Pulse: 78  Resp: 16  SpO2: 98%          Physical Exam Constitutional:      Appearance: He is not diaphoretic.  HENT:     Head: Normocephalic.     Right Ear: Tympanic membrane, ear canal and external ear normal.     Left  Ear: Tympanic membrane, ear canal and external ear normal.     Nose: Nose normal. No mucosal edema or rhinorrhea.     Mouth/Throat:     Pharynx: Uvula midline. No oropharyngeal exudate.  Eyes:     Conjunctiva/sclera: Conjunctivae normal.  Neck:     Thyroid: No thyromegaly.     Trachea: Trachea normal. No tracheal tenderness or tracheal deviation.  Cardiovascular:     Rate and Rhythm: Normal rate and regular rhythm.     Heart sounds: Normal heart sounds, S1 normal and S2 normal. No murmur heard. Pulmonary:     Effort: No respiratory distress.     Breath sounds: Normal breath sounds. No stridor. No wheezing or rales.  Lymphadenopathy:     Head:     Right side of head: No  tonsillar adenopathy.     Left side of head: No tonsillar adenopathy.     Cervical: No cervical adenopathy.  Skin:    Findings: No erythema or rash.     Nails: There is no clubbing.  Neurological:     Mental Status: He is alert.     Diagnostics: none  Assessment and Plan:   1. Perennial allergic rhinitis   2. Seasonal allergic rhinitis due to pollen   3. LPRD (laryngopharyngeal reflux disease)    1.  Allergen avoidance measures - dust mite, cat, cedar tree  2.  Treat inflammation of upper airway:   A. Ryaltris - 1-2 sprays each nostril 1-2 times per day  B. Montelukast 10 mg - 1 tablet 1 time per day  3.  Treat reflux/LPR:   A. Decrease caffeine, chocolate consumption  B. Omeprazole 40 mg - 1 tablet in AM  C. Famotidine 40 mg - 1 tablet in PM  4. Return to clinic in 8 weeks or earlier if problem. Taper medications???  Aaron May is really doing very well at this point in time on his current plan and I would like for him to remain on right Ultracin montelukast and omeprazole and famotidine for a full 12 weeks.  He is already completed 4 weeks and thus I will see him back in this clinic in 8 weeks.  I suspect we will be able to taper his medicines at that point.  Allena Katz, MD Allergy / Immunology [place]

## 2022-11-27 NOTE — Patient Instructions (Addendum)
  1.  Allergen avoidance measures - dust mite, cat, cedar tree  2.  Treat inflammation of upper airway:   A. Ryaltris - 1-2 sprays each nostril 1-2 times per day  B. Montelukast 10 mg - 1 tablet 1 time per day  3.  Treat reflux/LPR:   A. Decrease caffeine, chocolate consumption  B. Omeprazole 40 mg - 1 tablet in AM  C. Famotidine 40 mg - 1 tablet in PM  4. Return to clinic in 8 weeks or earlier if problem. Taper medications???

## 2022-11-28 ENCOUNTER — Encounter: Payer: Self-pay | Admitting: Allergy and Immunology

## 2022-12-16 ENCOUNTER — Ambulatory Visit (INDEPENDENT_AMBULATORY_CARE_PROVIDER_SITE_OTHER): Payer: Medicare Other | Admitting: Podiatry

## 2022-12-16 DIAGNOSIS — M79675 Pain in left toe(s): Secondary | ICD-10-CM | POA: Diagnosis not present

## 2022-12-16 DIAGNOSIS — M79674 Pain in right toe(s): Secondary | ICD-10-CM

## 2022-12-16 DIAGNOSIS — E119 Type 2 diabetes mellitus without complications: Secondary | ICD-10-CM

## 2022-12-16 DIAGNOSIS — B351 Tinea unguium: Secondary | ICD-10-CM | POA: Diagnosis not present

## 2022-12-16 NOTE — Progress Notes (Signed)
  Subjective:  Patient ID: Aaron May, male    DOB: 07-04-1941,  MRN: [id number]  Chief Complaint  Patient presents with   Nail Problem    Nail trim    82 y.o. male presents with the above complaint. History confirmed with patient. Patient presenting with pain related to dystrophic thickened elongated nails. Patient is unable to trim own nails related to nail dystrophy and/or mobility issues. 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  Objective:  Physical Exam: warm, good capillary refill nail exam onychomycosis of the toenails, onycholysis, and dystrophic nails DP pulses palpable, PT pulses palpable, and protective sensation intact Left Foot:  Pain with palpation of nails due to elongation and dystrophic growth.  Hallux nail is growing back in with decreased fungal thickening from prior Right Foot: Pain with palpation of nails due to elongation and dystrophic growth.  Right hallux nail has significant fungal dystrophic growth.  Assessment:   1. Pain due to onychomycosis of toenails of both feet   2. Diabetes mellitus without complication ([place])       Plan:  Patient was evaluated and treated and all questions answered.  #Onychomycosis with pain  -Nails palliatively debrided as below. - topical antifungal therapy PRN -Educated on self-care  Procedure: Nail Debridement Rationale: Pain Type of Debridement: manual, sharp debridement. Instrumentation: Nail nipper, rotary burr. Number of Nails: 10  Return in about 3 months (around 03/16/2023) for [facility].         Everitt Amber, DPM [facility]

## 2022-12-25 DIAGNOSIS — L821 Other seborrheic keratosis: Secondary | ICD-10-CM | POA: Diagnosis not present

## 2023-01-03 DIAGNOSIS — R071 Chest pain on breathing: Secondary | ICD-10-CM | POA: Diagnosis not present

## 2023-01-03 DIAGNOSIS — J069 Acute upper respiratory infection, unspecified: Secondary | ICD-10-CM | POA: Diagnosis not present

## 2023-01-03 DIAGNOSIS — R051 Acute cough: Secondary | ICD-10-CM | POA: Diagnosis not present

## 2023-01-03 DIAGNOSIS — R0981 Nasal congestion: Secondary | ICD-10-CM | POA: Diagnosis not present

## 2023-01-08 ENCOUNTER — Encounter: Payer: Self-pay | Admitting: Allergy and Immunology

## 2023-01-08 ENCOUNTER — Ambulatory Visit (INDEPENDENT_AMBULATORY_CARE_PROVIDER_SITE_OTHER): Payer: Medicare Other | Admitting: Allergy and Immunology

## 2023-01-08 VITALS — BP 126/60 | HR 72 | Resp 20

## 2023-01-08 DIAGNOSIS — J3089 Other allergic rhinitis: Secondary | ICD-10-CM

## 2023-01-08 DIAGNOSIS — J301 Allergic rhinitis due to pollen: Secondary | ICD-10-CM

## 2023-01-08 DIAGNOSIS — B349 Viral infection, unspecified: Secondary | ICD-10-CM

## 2023-01-08 DIAGNOSIS — K219 Gastro-esophageal reflux disease without esophagitis: Secondary | ICD-10-CM

## 2023-01-08 NOTE — Patient Instructions (Addendum)
  1.  Allergen avoidance measures - dust mite, cat, cedar tree  2.  Treat inflammation of upper airway:   A. Ryaltris - 1-2 sprays each nostril 1-2 times per day  B. Montelukast 10 mg - 1 tablet 1 time per day  3.  Treat reflux/LPR:   A. Decrease caffeine, chocolate consumption  B. Omeprazole 40 mg - 1 tablet in AM  C. Famotidine 40 mg - 1 tablet in PM  4. For this episode:   A. Prednisone 10 mg - 1 tablet 1 time per day for 5 days only  B. Nasal saline  C. Mucinex   5. Can attempt to stop montelukast when better  6. Return to clinic in 12 weeks or earlier if problem.

## 2023-01-08 NOTE — Progress Notes (Unsigned)
[place] - [place] - [place]   Follow-up Note  Referring Provider: Street, Sharon Mt, * Primary Provider: Street, Sharon Mt, MD Date of Office Visit: 01/08/2023  Subjective:   Aaron May (DOB: Mar 02, 1941) is a 82 y.o. male who returns to the [facility] on 01/08/2023 in re-evaluation of the following:  HPI: Aaron May returns to this clinic in evaluation of allergic rhinitis, LPR.  I last saw him in this clinic 27 November 2022.  He was doing wonderful until approximately 1 week ago at which point in time he had acute onset of cough and congestion and developed raspy voice and complete laryngitis and tightness in his chest and some runny nose and he went to the urgent care center within 24 hours of this event and apparently had some swabs which identified no identifiable viral illness and he was given Mucinex.  He has not had any fever or ugly nasal discharge or ugly sputum production or chest pain or associated systemic or constitutional symptoms.  Prior to this event he thought he was doing very well and did not really have any problems involving either his nose or chest and he continued to use medications directed against respiratory tract inflammation and reflux/LPR.  Allergies as of 01/08/2023   No Known Allergies      Medication List    Contour Test test strip Generic drug: glucose blood TEST BLOOD SUGAR UP TO QID AS DIRECTED   docusate sodium 100 MG capsule Commonly known as: COLACE Take 100 mg by mouth 2 (two) times daily.   famotidine 40 MG tablet Commonly known as: PEPCID Take 1 tablet (40 mg total) by mouth daily.   metFORMIN 1000 MG tablet Commonly known as: GLUCOPHAGE Take 1 tablet by mouth 2 (two) times daily.   montelukast 10 MG tablet Commonly known as: SINGULAIR Take 1 tablet (10 mg total) by mouth at bedtime.   Mucinex DM Maximum Strength 60-1200 MG Tb12 Take 1 tablet by mouth 2 (two) times daily.    mupirocin ointment 2 % Commonly known as: BACTROBAN saline followed by Bactroban ointment 3 times a day for 10 days   omeprazole 40 MG capsule Commonly known as: PRILOSEC Take 1 capsule (40 mg total) by mouth daily.   pioglitazone 30 MG tablet Commonly known as: ACTOS Take 30 mg by mouth daily.   Ryaltris [id number] MCG/ACT Susp Generic drug: Olopatadine-Mometasone 2 sprays each nostril 1-2 times per day   tadalafil 5 MG tablet Commonly known as: CIALIS Take 5 mg by mouth daily.   tamsulosin 0.4 MG Caps capsule Commonly known as: FLOMAX Take 0.4 mg by mouth daily.    Past Medical History:  Diagnosis Date   Allergic rhinitis    Diabetes mellitus without complication ([place])    Snores    Wears glasses     Past Surgical History:  Procedure Laterality Date   COLONOSCOPY     INCISIONAL HERNIA REPAIR     right and left   TONSILLECTOMY      Review of systems negative except as noted in HPI / PMHx or noted below:  Review of Systems  Constitutional: Negative.   HENT: Negative.    Eyes: Negative.   Respiratory: Negative.    Cardiovascular: Negative.   Gastrointestinal: Negative.   Genitourinary: Negative.   Musculoskeletal: Negative.   Skin: Negative.   Neurological: Negative.   Endo/Heme/Allergies: Negative.   Psychiatric/Behavioral: Negative.       Objective:   Vitals:  01/08/23 1351  BP: 126/60  Pulse: 72  Resp: 20  SpO2: 96%          Physical Exam Constitutional:      Comments: Deep raspy voice     Diagnostics: none  Assessment and Plan:   1. Perennial allergic rhinitis   2. Seasonal allergic rhinitis due to pollen   3. LPRD (laryngopharyngeal reflux disease)   4. Viral illness    1.  Allergen avoidance measures - dust mite, cat, cedar tree  2.  Treat inflammation of upper airway:   A. Ryaltris - 1-2 sprays each nostril 1-2 times per day  B. Montelukast 10 mg - 1 tablet 1 time per day  3.  Treat reflux/LPR:   A. Decrease caffeine,  chocolate consumption  B. Omeprazole 40 mg - 1 tablet in AM  C. Famotidine 40 mg - 1 tablet in PM  4. For this episode:   A. Prednisone 10 mg - 1 tablet 1 time per day for 5 days only  B. Nasal saline  C. Mucinex   5. Can attempt to stop montelukast when better  6. Return to clinic in 12 weeks or earlier if problem.    Aaron May appears to have a viral respiratory tract infection and he is improving somewhat in the past 24 hours or so and we will assume he will continue to improve and he will utilize the plan noted above to address this event and we will see him back in this clinic in 12 weeks while he continues to treat inflammation of his airway and LPR with the plan noted above.  Once he gets better he can make an attempt to discontinue his montelukast.  Allena Katz, MD Allergy / [place]

## 2023-01-09 ENCOUNTER — Encounter: Payer: Self-pay | Admitting: Allergy and Immunology

## 2023-01-09 ENCOUNTER — Other Ambulatory Visit: Payer: Self-pay

## 2023-01-09 MED ORDER — MUPIROCIN 2 % EX OINT: TOPICAL_OINTMENT | CUTANEOUS | 2 refills | Status: AC

## 2023-01-14 DIAGNOSIS — K648 Other hemorrhoids: Secondary | ICD-10-CM | POA: Diagnosis not present

## 2023-01-15 DIAGNOSIS — E7800 Pure hypercholesterolemia, unspecified: Secondary | ICD-10-CM | POA: Diagnosis not present

## 2023-01-15 DIAGNOSIS — E785 Hyperlipidemia, unspecified: Secondary | ICD-10-CM | POA: Diagnosis not present

## 2023-01-15 DIAGNOSIS — E1169 Type 2 diabetes mellitus with other specified complication: Secondary | ICD-10-CM | POA: Diagnosis not present

## 2023-01-16 DIAGNOSIS — E1169 Type 2 diabetes mellitus with other specified complication: Secondary | ICD-10-CM | POA: Diagnosis not present

## 2023-01-16 DIAGNOSIS — E113299 Type 2 diabetes mellitus with mild nonproliferative diabetic retinopathy without macular edema, unspecified eye: Secondary | ICD-10-CM | POA: Diagnosis not present

## 2023-01-16 DIAGNOSIS — G63 Polyneuropathy in diseases classified elsewhere: Secondary | ICD-10-CM | POA: Diagnosis not present

## 2023-01-16 DIAGNOSIS — E7800 Pure hypercholesterolemia, unspecified: Secondary | ICD-10-CM | POA: Diagnosis not present

## 2023-01-17 ENCOUNTER — Ambulatory Visit (INDEPENDENT_AMBULATORY_CARE_PROVIDER_SITE_OTHER): Payer: Medicare Other | Admitting: Podiatry

## 2023-01-17 ENCOUNTER — Ambulatory Visit (INDEPENDENT_AMBULATORY_CARE_PROVIDER_SITE_OTHER): Payer: Medicare Other

## 2023-01-17 ENCOUNTER — Encounter: Payer: Self-pay | Admitting: Podiatry

## 2023-01-17 DIAGNOSIS — S92511A Displaced fracture of proximal phalanx of right lesser toe(s), initial encounter for closed fracture: Secondary | ICD-10-CM

## 2023-01-17 DIAGNOSIS — S99921A Unspecified injury of right foot, initial encounter: Secondary | ICD-10-CM | POA: Diagnosis not present

## 2023-01-17 DIAGNOSIS — B351 Tinea unguium: Secondary | ICD-10-CM

## 2023-01-18 NOTE — Progress Notes (Signed)
Subjective:   Patient ID: Aaron May, male   DOB: 82 y.o.   MRN: [id number]   HPI Patient presents stating he traumatized the fourth toe on his right foot 2 weeks ago and it still very swollen and painful and he think he may have broke it and also he is concerned about nail thickness discoloration bilateral with no pain   ROS      Objective:  Physical Exam  Neurovascular status intact swelling of the right fourth digit with pain and also nail disease hallux both feet that are moderately thickened and difficult for him to cut     Assessment:  Probability for fracture fourth digit right with mycotic infection bilateral that does not give him problems pain wise     Plan:  H&P reviewed both conditions and at this point went ahead reviewed x-rays discussed wider shoes softer material and open toed shoes as best as possible.  Explained that we will probably take 8 to 12 weeks for this to heal completely  X-rays indicate fracture of the proximal phalanx digit 4 right that is not involving joint surface

## 2023-01-20 ENCOUNTER — Other Ambulatory Visit: Payer: Self-pay | Admitting: Podiatry

## 2023-01-20 DIAGNOSIS — S99921A Unspecified injury of right foot, initial encounter: Secondary | ICD-10-CM

## 2023-01-20 DIAGNOSIS — B351 Tinea unguium: Secondary | ICD-10-CM

## 2023-01-20 DIAGNOSIS — S92511A Displaced fracture of proximal phalanx of right lesser toe(s), initial encounter for closed fracture: Secondary | ICD-10-CM

## 2023-01-22 ENCOUNTER — Ambulatory Visit: Payer: Medicare Other | Admitting: Allergy and Immunology

## 2023-02-25 DIAGNOSIS — K6289 Other specified diseases of anus and rectum: Secondary | ICD-10-CM | POA: Diagnosis not present

## 2023-03-18 ENCOUNTER — Ambulatory Visit (INDEPENDENT_AMBULATORY_CARE_PROVIDER_SITE_OTHER): Payer: Medicare Other | Admitting: Podiatry

## 2023-03-18 DIAGNOSIS — B351 Tinea unguium: Secondary | ICD-10-CM

## 2023-03-18 DIAGNOSIS — E119 Type 2 diabetes mellitus without complications: Secondary | ICD-10-CM

## 2023-03-18 DIAGNOSIS — M79675 Pain in left toe(s): Secondary | ICD-10-CM | POA: Diagnosis not present

## 2023-03-18 DIAGNOSIS — M79674 Pain in right toe(s): Secondary | ICD-10-CM

## 2023-03-18 DIAGNOSIS — L600 Ingrowing nail: Secondary | ICD-10-CM | POA: Diagnosis not present

## 2023-03-18 NOTE — Addendum Note (Signed)
Addended by: Carlena Hurl F on: 03/18/2023 01:39 PM   Modules accepted: Level of Service

## 2023-03-18 NOTE — Progress Notes (Addendum)
  Subjective:  Patient ID: Aaron May, male    DOB: 09-01-1941,  MRN: [id number]  Chief Complaint  Patient presents with   Nail Problem    Patient requesting removal of right hallux due to nail fungus. Denies any pain at this time.     82 y.o. male presents with the above complaint. History confirmed with patient. Patient presenting with pain related to dystrophic thickened elongated nails. Patient is unable to trim own nails related to nail dystrophy and/or mobility issues. 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  Patient is having significant pain in the right hallux nail due to nail fungal dystrophy.  Does not want it removed totally just half the nail.  Objective:  Physical Exam: warm, good capillary refill nail exam onychomycosis of the toenails, onycholysis, and dystrophic nails DP pulses palpable, PT pulses palpable, and protective sensation intact Left Foot:  Pain with palpation of nails due to elongation and dystrophic growth.  Hallux nail is growing  Right Foot: Pain with palpation of nails due to elongation and dystrophic growth.  Right hallux nail has significant fungal dystrophic growth.  Assessment:   1. Pain due to onychomycosis of toenails of both feet   2. Diabetes mellitus without complication (H[facility])   3. Ingrown nail of great toe of right foot     Plan:  Patient was evaluated and treated and all questions answered.   # Fungal dystrophy and right great toe -Discussed treatment options with the patient -Recommended total nail avulsion however patient wanted to defer this procedure he just wanted me to trim portion of the nail off which I did at this visit -Discussed we can proceed with nail removal as needed in the future either temporary or permanent        Corinna Gab, DPM [facility] / [facility]

## 2023-03-26 DIAGNOSIS — E113299 Type 2 diabetes mellitus with mild nonproliferative diabetic retinopathy without macular edema, unspecified eye: Secondary | ICD-10-CM | POA: Diagnosis not present

## 2023-03-26 DIAGNOSIS — E785 Hyperlipidemia, unspecified: Secondary | ICD-10-CM | POA: Diagnosis not present

## 2023-03-26 DIAGNOSIS — E1169 Type 2 diabetes mellitus with other specified complication: Secondary | ICD-10-CM | POA: Diagnosis not present

## 2023-03-26 DIAGNOSIS — E7800 Pure hypercholesterolemia, unspecified: Secondary | ICD-10-CM | POA: Diagnosis not present

## 2023-03-26 DIAGNOSIS — Z0000 Encounter for general adult medical examination without abnormal findings: Secondary | ICD-10-CM | POA: Diagnosis not present

## 2023-04-02 ENCOUNTER — Ambulatory Visit (INDEPENDENT_AMBULATORY_CARE_PROVIDER_SITE_OTHER): Payer: Medicare Other | Admitting: Allergy and Immunology

## 2023-04-02 DIAGNOSIS — J301 Allergic rhinitis due to pollen: Secondary | ICD-10-CM

## 2023-04-02 DIAGNOSIS — K219 Gastro-esophageal reflux disease without esophagitis: Secondary | ICD-10-CM

## 2023-04-02 DIAGNOSIS — J3089 Other allergic rhinitis: Secondary | ICD-10-CM

## 2023-04-02 NOTE — Progress Notes (Unsigned)
[place] - [place] - [place] - [place]   Follow-up Note  Referring Provider: Street, Stephanie Coup, * Primary Provider: Street, Stephanie Coup, MD Date of Office Visit: 04/02/2023  Subjective:   Aaron May (DOB: Nov 10, 1940) is a 82 y.o. male who [facility] in re-evaluation of the following:  HPI: Aaron May returns to this clinic in evaluation of allergic rhinitis and LPR.  I last saw him in this clinic 08 January 2023.  He has really done well and has resolved all of his upper respiratory tract problems and has resolved all of his throat issues and has been able to slowly taper down his medications.  He is now using his combination nasal spray just 1 time per day and he has been able to of eliminate montelukast and he continues on famotidine and has been able to eliminate omeprazole.    Allergies as of 04/02/2023   No Known Allergies      Medication List    Contour Test test strip Generic drug: glucose blood TEST BLOOD SUGAR UP TO QID AS DIRECTED   docusate sodium 100 MG capsule Commonly known as: COLACE Take 100 mg by mouth 2 (two) times daily.   famotidine 40 MG tablet Commonly known as: PEPCID Take 1 tablet (40 mg total) by mouth daily.   metFORMIN 1000 MG tablet Commonly known as: GLUCOPHAGE Take 1 tablet by mouth 2 (two) times daily.   montelukast 10 MG tablet Commonly known as: SINGULAIR Take 1 tablet (10 mg total) by mouth at bedtime.   Mucinex DM Maximum Strength 60-1200 MG Tb12 Take 1 tablet by mouth 2 (two) times daily.   mupirocin ointment 2 % Commonly known as: BACTROBAN saline followed by Bactroban ointment 3 times a day as needed   omeprazole 40 MG capsule Commonly known as: PRILOSEC Take 1 capsule (40 mg total) by mouth daily.   pioglitazone 30 MG tablet Commonly known as: ACTOS Take 30 mg by mouth daily.   Ryaltris 161-09 MCG/ACT Susp Generic drug: Olopatadine-Mometasone 2  sprays each nostril 1-2 times per day   tadalafil 5 MG tablet Commonly known as: CIALIS Take 5 mg by mouth daily.   tamsulosin 0.4 MG Caps capsule Commonly known as: FLOMAX Take 0.4 mg by mouth daily.    Past Medical History:  Diagnosis Date   Allergic rhinitis    Diabetes mellitus without complication (HCC)    Snores    Wears glasses     Past Surgical History:  Procedure Laterality Date   COLONOSCOPY     INCISIONAL HERNIA REPAIR     right and left   TONSILLECTOMY      Review of systems negative except as noted in HPI / PMHx or noted below:  Review of Systems  Constitutional: Negative.   HENT: Negative.    Eyes: Negative.   Respiratory: Negative.    Cardiovascular: Negative.   Gastrointestinal: Negative.   Genitourinary: Negative.   Musculoskeletal: Negative.   Skin: Negative.   Neurological: Negative.   Endo/Heme/Allergies: Negative.   Psychiatric/Behavioral: Negative.       Objective:   There were no vitals filed for this visit.        Physical Exam Constitutional:      Appearance: He is not diaphoretic.  HENT:     Head: Normocephalic.     Right Ear: Tympanic membrane, ear canal and external ear normal.     Left Ear: Tympanic membrane, ear canal and  external ear normal.     Nose: Nose normal. No mucosal edema or rhinorrhea.     Mouth/Throat:     Pharynx: Uvula midline. No oropharyngeal exudate.  Eyes:     Conjunctiva/sclera: Conjunctivae normal.  Neck:     Thyroid: No thyromegaly.     Trachea: Trachea normal. No tracheal tenderness or tracheal deviation.  Cardiovascular:     Rate and Rhythm: Normal rate and regular rhythm.     Heart sounds: Normal heart sounds, S1 normal and S2 normal. No murmur heard. Pulmonary:     Effort: No respiratory distress.     Breath sounds: Normal breath sounds. No stridor. No wheezing or rales.  Lymphadenopathy:     Head:     Right side of head: No tonsillar adenopathy.     Left side of head: No tonsillar  adenopathy.     Cervical: No cervical adenopathy.  Skin:    Findings: No erythema or rash.     Nails: There is no clubbing.  Neurological:     Mental Status: He is alert.     Diagnostics: none  Assessment and Plan:   1. Perennial allergic rhinitis   2. Seasonal allergic rhinitis due to pollen   3. LPRD (laryngopharyngeal reflux disease)    1.  Allergen avoidance measures - dust mite, cat, cedar tree  2.  Treat inflammation of upper airway:   A. Ryaltris - 1-2 sprays each nostril 1-2 times per day  3.  Treat reflux/LPR:   A. Minimize caffeine, chocolate consumption  B. Famotidine 40 mg - 1 tablet in PM  4. Return to clinic in 6 months or earlier if problem.    5. Plan for fall flu vaccine  Aaron May is really doing very well and he has been able to taper down his medications using his combination nasal spray just 1 time per day and using famotidine to treat his LPR and I will assume that he will continue to do well with this plan we will see him back in this clinic in 6 months or earlier if there is a problem.  Laurette Schimke, MD Allergy / Immunology [facility]

## 2023-04-02 NOTE — Patient Instructions (Addendum)
  1.  Allergen avoidance measures - dust mite, cat, cedar tree  2.  Treat inflammation of upper airway:   A. Ryaltris - 1-2 sprays each nostril 1-2 times per day  3.  Treat reflux/LPR:   A. Minimize caffeine, chocolate consumption  B. Famotidine 40 mg - 1 tablet in PM  4. Return to clinic in 6 months or earlier if problem.    5. Plan for fall flu vaccine

## 2023-04-03 ENCOUNTER — Encounter: Payer: Self-pay | Admitting: Allergy and Immunology

## 2023-04-16 DIAGNOSIS — Z125 Encounter for screening for malignant neoplasm of prostate: Secondary | ICD-10-CM | POA: Diagnosis not present

## 2023-04-16 DIAGNOSIS — N401 Enlarged prostate with lower urinary tract symptoms: Secondary | ICD-10-CM | POA: Diagnosis not present

## 2023-04-16 DIAGNOSIS — R351 Nocturia: Secondary | ICD-10-CM | POA: Diagnosis not present

## 2023-04-28 DIAGNOSIS — S60941A Unspecified superficial injury of left index finger, initial encounter: Secondary | ICD-10-CM | POA: Diagnosis not present

## 2023-06-16 ENCOUNTER — Ambulatory Visit (INDEPENDENT_AMBULATORY_CARE_PROVIDER_SITE_OTHER): Payer: Medicare Other | Admitting: Podiatry

## 2023-06-16 DIAGNOSIS — M79675 Pain in left toe(s): Secondary | ICD-10-CM

## 2023-06-16 DIAGNOSIS — B351 Tinea unguium: Secondary | ICD-10-CM | POA: Diagnosis not present

## 2023-06-16 DIAGNOSIS — E119 Type 2 diabetes mellitus without complications: Secondary | ICD-10-CM

## 2023-06-16 DIAGNOSIS — M79674 Pain in right toe(s): Secondary | ICD-10-CM | POA: Diagnosis not present

## 2023-06-16 DIAGNOSIS — L600 Ingrowing nail: Secondary | ICD-10-CM

## 2023-06-16 NOTE — Progress Notes (Signed)
  Subjective:  Patient ID: Aaron May, male    DOB: 23-Aug-1941,  MRN: [id number]  Chief Complaint  Patient presents with   Nail Problem    Pt states his left great toe nail is starting to get some pressure     82 y.o. male presents with the above complaint. History confirmed with patient. Patient presenting with pain related to dystrophic thickened elongated nails. Patient is unable to trim own nails related to nail dystrophy and/or mobility issues. 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    Objective:  Physical Exam: warm, good capillary refill nail exam onychomycosis of the toenails, onycholysis, and dystrophic nails DP pulses palpable, PT pulses palpable, and protective sensation intact Left Foot:  Pain with palpation of nails due to elongation and dystrophic growth.  Hallux nail is thickened and painful dystally Right Foot: Pain with palpation of nails due to elongation and dystrophic growth.  Right hallux nail has significant fungal dystrophic growth.  Assessment:   1. Pain due to onychomycosis of toenails of both feet   2. Ingrown nail of great toe of left foot   3. Diabetes mellitus without complication (HCC)     Plan:  Patient was evaluated and treated and all questions answered.   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 -Educated on self-care  Procedure: Nail Debridement Rationale: Pain Type of Debridement: manual, sharp debridement. Instrumentation: Nail nipper, rotary burr. Number of Nails: 10        Corinna Gab, DPM [facility] / [facility]

## 2023-07-10 DIAGNOSIS — E1169 Type 2 diabetes mellitus with other specified complication: Secondary | ICD-10-CM | POA: Diagnosis not present

## 2023-07-10 DIAGNOSIS — E7800 Pure hypercholesterolemia, unspecified: Secondary | ICD-10-CM | POA: Diagnosis not present

## 2023-07-10 DIAGNOSIS — E785 Hyperlipidemia, unspecified: Secondary | ICD-10-CM | POA: Diagnosis not present

## 2023-07-17 DIAGNOSIS — E113299 Type 2 diabetes mellitus with mild nonproliferative diabetic retinopathy without macular edema, unspecified eye: Secondary | ICD-10-CM | POA: Diagnosis not present

## 2023-07-17 DIAGNOSIS — E1169 Type 2 diabetes mellitus with other specified complication: Secondary | ICD-10-CM | POA: Diagnosis not present

## 2023-07-17 DIAGNOSIS — Z23 Encounter for immunization: Secondary | ICD-10-CM | POA: Diagnosis not present

## 2023-07-17 DIAGNOSIS — E7800 Pure hypercholesterolemia, unspecified: Secondary | ICD-10-CM | POA: Diagnosis not present

## 2023-07-17 DIAGNOSIS — E785 Hyperlipidemia, unspecified: Secondary | ICD-10-CM | POA: Diagnosis not present

## 2023-08-26 DIAGNOSIS — H40013 Open angle with borderline findings, low risk, bilateral: Secondary | ICD-10-CM | POA: Diagnosis not present

## 2023-08-26 DIAGNOSIS — E119 Type 2 diabetes mellitus without complications: Secondary | ICD-10-CM | POA: Diagnosis not present

## 2023-08-26 DIAGNOSIS — H2513 Age-related nuclear cataract, bilateral: Secondary | ICD-10-CM | POA: Diagnosis not present

## 2023-08-26 DIAGNOSIS — H52203 Unspecified astigmatism, bilateral: Secondary | ICD-10-CM | POA: Diagnosis not present

## 2023-09-16 ENCOUNTER — Ambulatory Visit (INDEPENDENT_AMBULATORY_CARE_PROVIDER_SITE_OTHER): Payer: Medicare Other | Admitting: Podiatry

## 2023-09-16 DIAGNOSIS — M79675 Pain in left toe(s): Secondary | ICD-10-CM

## 2023-09-16 DIAGNOSIS — M79674 Pain in right toe(s): Secondary | ICD-10-CM | POA: Diagnosis not present

## 2023-09-16 DIAGNOSIS — B351 Tinea unguium: Secondary | ICD-10-CM

## 2023-09-16 DIAGNOSIS — E119 Type 2 diabetes mellitus without complications: Secondary | ICD-10-CM

## 2023-09-16 DIAGNOSIS — L600 Ingrowing nail: Secondary | ICD-10-CM

## 2023-09-16 NOTE — Progress Notes (Signed)
  Subjective:  Patient ID: Aaron May, male    DOB: [birth year],  MRN: [id number]  Chief Complaint  Patient presents with   Follow-up    Pressure remaining in left 1st toe. Also has large purplish area on inside of left ankle. No known injury, no pain. Right foot is doing okay, no pressure on toenail.     82 y.o. male presents with the above complaint. History confirmed with patient. Patient presenting with pain related to dystrophic thickened elongated nails. Patient is unable to trim own nails related to nail dystrophy and/or mobility issues. 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    Objective:  Physical Exam: warm, good capillary refill nail exam onychomycosis of the toenails, onycholysis, and dystrophic nails DP pulses palpable, PT pulses palpable, and protective sensation intact Left Foot:  Pain with palpation of nails due to elongation and dystrophic growth.  Hallux nail is thickened and painful dystally Right Foot: Pain with palpation of nails due to elongation and dystrophic growth.  Right hallux nail has significant fungal dystrophic growth.  Assessment:   1. Pain due to onychomycosis of toenails of both feet   2. Ingrown nail of great toe of left foot   3. Diabetes mellitus without complication (HCC)      Plan:  Patient was evaluated and treated and all questions answered.   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 -Educated on self-care  Procedure: Nail Debridement Rationale: Pain Type of Debridement: manual, sharp  debridement. Instrumentation: Nail nipper, rotary burr. Number of Nails: 10        Corinna Gab, DPM [facility] / [facility]

## 2023-09-17 DIAGNOSIS — L2089 Other atopic dermatitis: Secondary | ICD-10-CM | POA: Diagnosis not present

## 2023-09-17 DIAGNOSIS — D2261 Melanocytic nevi of right upper limb, including shoulder: Secondary | ICD-10-CM | POA: Diagnosis not present

## 2023-09-17 DIAGNOSIS — D225 Melanocytic nevi of trunk: Secondary | ICD-10-CM | POA: Diagnosis not present

## 2023-09-17 DIAGNOSIS — L821 Other seborrheic keratosis: Secondary | ICD-10-CM | POA: Diagnosis not present

## 2023-10-09 ENCOUNTER — Ambulatory Visit: Payer: Medicare Other | Admitting: Allergy and Immunology

## 2023-11-03 DIAGNOSIS — M79674 Pain in right toe(s): Secondary | ICD-10-CM | POA: Diagnosis not present

## 2023-11-03 DIAGNOSIS — M79644 Pain in right finger(s): Secondary | ICD-10-CM | POA: Diagnosis not present

## 2023-11-03 DIAGNOSIS — S61316A Laceration without foreign body of right little finger with damage to nail, initial encounter: Secondary | ICD-10-CM | POA: Diagnosis not present

## 2023-12-04 ENCOUNTER — Ambulatory Visit (INDEPENDENT_AMBULATORY_CARE_PROVIDER_SITE_OTHER): Payer: Medicare Other | Admitting: Allergy and Immunology

## 2023-12-04 VITALS — BP 118/76 | HR 75 | Resp 16 | Wt 182.6 lb

## 2023-12-04 DIAGNOSIS — J301 Allergic rhinitis due to pollen: Secondary | ICD-10-CM

## 2023-12-04 DIAGNOSIS — J3089 Other allergic rhinitis: Secondary | ICD-10-CM | POA: Diagnosis not present

## 2023-12-04 DIAGNOSIS — K219 Gastro-esophageal reflux disease without esophagitis: Secondary | ICD-10-CM | POA: Diagnosis not present

## 2023-12-04 NOTE — Progress Notes (Signed)
 [place] - [place] - [place] - [place] - [place]   Follow-up Note  Referring Provider: Street, Lonni HERO, * Primary Provider: Street, Lonni HERO, MD Date of Office Visit: 12/04/2023  Subjective:   Aaron May (DOB: 10/06/1941) is a 83 y.o. male who [facility]23 in re-evaluation of the following:  HPI: Aaron May returns to this clinic in evaluation of allergic rhinitis and LPR.  I last saw him in his clinic 02 April 2023  He has really done well since I have last seen him in this clinic and has no significant issues involving either his upper airway or his throat or his classic reflux and he has discontinued all of his medications utilized to address these issues and overall is very happy with the response that he received and does not feel at this point in time that he needs any medications.  Allergies as of 12/04/2023   No Known Allergies      Medication List    Contour Test test strip Generic drug: glucose blood TEST BLOOD SUGAR UP TO QID AS DIRECTED   famotidine  40 MG tablet Commonly known as: PEPCID  Take 1 tablet (40 mg total) by mouth daily.   metFORMIN 1000 MG tablet Commonly known as: GLUCOPHAGE Take 1 tablet by mouth 2 (two) times daily.   pioglitazone 30 MG tablet Commonly known as: ACTOS Take 30 mg by mouth daily.   Ryaltris  665-25 MCG/ACT Susp Generic drug: Olopatadine-Mometasone 2 sprays each nostril 1-2 times per day   tadalafil 5 MG tablet Commonly known as: CIALIS Take 5 mg by mouth daily.   tamsulosin 0.4 MG Caps capsule Commonly known as: FLOMAX Take 0.4 mg by mouth daily.    Past Medical History:  Diagnosis Date   Allergic rhinitis    Diabetes mellitus without complication (HCC)    Snores    Wears glasses     Past Surgical History:  Procedure Laterality Date   COLONOSCOPY     INCISIONAL HERNIA REPAIR     right and left   TONSILLECTOMY      Review of systems negative except  as noted in HPI / PMHx or noted below:  Review of Systems  Constitutional: Negative.   HENT: Negative.    Eyes: Negative.   Respiratory: Negative.    Cardiovascular: Negative.   Gastrointestinal: Negative.   Genitourinary: Negative.   Musculoskeletal: Negative.   Skin: Negative.   Neurological: Negative.   Endo/Heme/Allergies: Negative.   Psychiatric/Behavioral: Negative.       Objective:   Vitals:   12/04/23 1700  BP: 118/76  Pulse: 75  Resp: 16  SpO2: 96%      Weight: 182 lb 9.6 oz (82.8 kg)   Physical Exam Constitutional:      Appearance: He is not diaphoretic.  HENT:     Head: Normocephalic.     Right Ear: Tympanic membrane, ear canal and external ear normal.     Left Ear: Tympanic membrane, ear canal and external ear normal.     Nose: Nose normal. No mucosal edema or rhinorrhea.     Mouth/Throat:     Pharynx: Uvula midline. No oropharyngeal exudate.  Eyes:     Conjunctiva/sclera: Conjunctivae normal.  Neck:     Thyroid : No thyromegaly.     Trachea: Trachea normal. No tracheal tenderness or tracheal deviation.  Cardiovascular:     Rate and Rhythm: Normal rate and regular rhythm.     Heart sounds: Normal heart  sounds, S1 normal and S2 normal. No murmur heard. Pulmonary:     Effort: No respiratory distress.     Breath sounds: Normal breath sounds. No stridor. No wheezing or rales.  Lymphadenopathy:     Head:     Right side of head: No tonsillar adenopathy.     Left side of head: No tonsillar adenopathy.     Cervical: No cervical adenopathy.  Skin:    Findings: No erythema or rash.     Nails: There is no clubbing.  Neurological:     Mental Status: He is alert.     Diagnostics: none  Assessment and Plan:   1. Perennial allergic rhinitis   2. Seasonal allergic rhinitis due to pollen   3. LPRD (laryngopharyngeal reflux disease)    1.  Allergen avoidance measures - dust mite, cat, cedar tree  2.  If needed:   A. Ryaltris  - 1-2 sprays each  nostril 1-2 times per day  B. Famotidine  40 mg - 1 tablet in PM  3.  Influenza = Tamiflu. Covid = Paxlovid  4. Return to clinic in 1 year or earlier if problem.    Aaron May appears to be doing very well without the use of medications utilized on a consistent basis to address his allergic rhinitis and LPR and he certainly has the option of restarting his combined nasal steroid/antihistamine and a H2 receptor blocker should he develop problems with either his rhinitis or reflux.  He will contact this clinic if he does not do well with this plan.  Camellia Denis, MD Allergy  / Immunology [facility]

## 2023-12-04 NOTE — Patient Instructions (Addendum)
  1.  Allergen avoidance measures - dust mite, cat, cedar tree  2.  If needed:   A. Ryaltris  - 1-2 sprays each nostril 1-2 times per day  B. Famotidine  40 mg - 1 tablet in PM  3.  Influenza = Tamiflu. Covid = Paxlovid  4. Return to clinic in 1 year or earlier if problem.

## 2023-12-08 ENCOUNTER — Encounter: Payer: Self-pay | Admitting: Allergy and Immunology

## 2023-12-11 DIAGNOSIS — L603 Nail dystrophy: Secondary | ICD-10-CM | POA: Diagnosis not present

## 2023-12-31 ENCOUNTER — Ambulatory Visit: Admitting: Sports Medicine

## 2024-01-05 ENCOUNTER — Encounter: Payer: Self-pay | Admitting: Family Medicine

## 2024-01-05 ENCOUNTER — Ambulatory Visit (INDEPENDENT_AMBULATORY_CARE_PROVIDER_SITE_OTHER): Admitting: Family Medicine

## 2024-01-05 VITALS — BP 124/86 | Ht 73.0 in | Wt 175.0 lb

## 2024-01-05 DIAGNOSIS — M25561 Pain in right knee: Secondary | ICD-10-CM

## 2024-01-05 NOTE — Progress Notes (Signed)
 PCP: Street, Stephanie Coup, MD  Subjective:   HPI: Patient is a 83 y.o. male here for right knee pain.  Patient reports about 3 weeks ago he started to get anterolateral right knee pain. No acute injury. 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 Pain more when he's using stairs and on uneven ground. No catching, giving out.  Past Medical History:  Diagnosis Date   Allergic rhinitis    Diabetes mellitus without complication (HCC)    Snores    Wears glasses     Current Outpatient Medications on File Prior to Visit  Medication Sig Dispense Refill   CONTOUR TEST test strip TEST BLOOD SUGAR UP TO QID AS DIRECTED     famotidine (PEPCID) 40 MG tablet Take 1 tablet (40 mg total) by mouth daily. 30 tablet 5   metFORMIN (GLUCOPHAGE) 1000 MG tablet Take 1 tablet by mouth 2 (two) times daily.     mupirocin ointment (BACTROBAN) 2 % saline followed by Bactroban ointment 3 times a day as needed 30 g 2   Olopatadine-Mometasone (RYALTRIS) 665-25 MCG/ACT SUSP 2 sprays each nostril 1-2 times per day 29 g 5   pioglitazone (ACTOS) 30 MG tablet Take 30 mg by mouth daily.     tadalafil (CIALIS) 5 MG tablet Take 5 mg by mouth daily.     tamsulosin (FLOMAX) 0.4 MG CAPS capsule Take 0.4 mg by mouth daily.     No current facility-administered medications on file prior to visit.    Past Surgical History:  Procedure Laterality Date   COLONOSCOPY     INCISIONAL HERNIA REPAIR     right and left   TONSILLECTOMY      No Known Allergies  BP 124/86   Ht 6\' 1"  (1.854 m)   Wt 175 lb (79.4 kg)   BMI 23.09 kg/m      09/25/2020    2:34 PM  [facility]  Frequency of aerobic exercise (# of days/week) 3  Average time in minutes 60  Frequency of strengthening activities (# of days/week) 3        No data to display              Objective:  Physical Exam:  Gen: NAD,  comfortable in exam room  Right knee: No gross deformity, ecchymoses, swelling.  Minimal TTP distal IT band.  No joint line, other tenderness. FROM with normal strength.  No patellar shift. Negative ant/post drawers. Negative valgus/varus testing. Negative lachman.  Negative mcmurrays, apleys, thessalys. NV intact distally. Mild positive obers.  Negative noble.   Assessment & Plan:  1. 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  Icing if needed.  Home exercises and stretches given.  Tylenol, topical voltaren gel.  Follow up in 1 month.

## 2024-01-05 NOTE — Patient Instructions (Signed)
 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 Avoid squats, lunges, stairs and hills when possible. Icing if needed. Do strengthening exercises daily. Stretches - pick 2-3 and hold for 20-30 seconds x 3 - do once or twice a day. Tylenol, topical voltaren gel if needed. Consider physical therapy if you're not improving. Follow up with me in 1 month.

## 2024-01-12 DIAGNOSIS — M79644 Pain in right finger(s): Secondary | ICD-10-CM | POA: Diagnosis not present

## 2024-01-12 DIAGNOSIS — S61316A Laceration without foreign body of right little finger with damage to nail, initial encounter: Secondary | ICD-10-CM | POA: Diagnosis not present

## 2024-01-14 DIAGNOSIS — K644 Residual hemorrhoidal skin tags: Secondary | ICD-10-CM | POA: Diagnosis not present

## 2024-01-29 DIAGNOSIS — R351 Nocturia: Secondary | ICD-10-CM | POA: Diagnosis not present

## 2024-01-29 DIAGNOSIS — N401 Enlarged prostate with lower urinary tract symptoms: Secondary | ICD-10-CM | POA: Diagnosis not present

## 2024-01-29 DIAGNOSIS — Z125 Encounter for screening for malignant neoplasm of prostate: Secondary | ICD-10-CM | POA: Diagnosis not present

## 2024-01-30 DIAGNOSIS — S335XXA Sprain of ligaments of lumbar spine, initial encounter: Secondary | ICD-10-CM | POA: Diagnosis not present

## 2024-02-05 ENCOUNTER — Encounter: Payer: Self-pay | Admitting: Family Medicine

## 2024-02-05 ENCOUNTER — Ambulatory Visit: Admitting: Family Medicine

## 2024-02-05 VITALS — BP 132/80 | Ht 73.0 in | Wt 175.0 lb

## 2024-02-05 DIAGNOSIS — M25561 Pain in right knee: Secondary | ICD-10-CM

## 2024-02-05 NOTE — Progress Notes (Signed)
 PCP: Street, Stephanie Coup, MD  Subjective:   HPI: Patient is a 83 y.o. male here for right knee pain.  3/10: Patient reports about 3 weeks ago he started to get anterolateral right knee pain. No acute injury. 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 Pain more when he's using stairs and on uneven ground. No catching, giving out.  4/10: Patient reports he's doing well and knee is improving. No restrictions on activity. He did have some left low back pain and went to the emergency department - concerned he had a kidney issue but testing negative - told this was a muscle strain but this is improving. - took muscle relaxant and nsaids initially.  Past Medical History:  Diagnosis Date   Allergic rhinitis    Diabetes mellitus without complication (HCC)    Snores    Wears glasses     Current Outpatient Medications on File Prior to Visit  Medication Sig Dispense Refill   CONTOUR TEST test strip TEST BLOOD SUGAR UP TO QID AS DIRECTED     famotidine (PEPCID) 40 MG tablet Take 1 tablet (40 mg total) by mouth daily. 30 tablet 5   metFORMIN (GLUCOPHAGE) 1000 MG tablet Take 1 tablet by mouth 2 (two) times daily.     mupirocin ointment (BACTROBAN) 2 % saline followed by Bactroban ointment 3 times a day as needed 30 g 2   Olopatadine-Mometasone (RYALTRIS) 665-25 MCG/ACT SUSP 2 sprays each nostril 1-2 times per day 29 g 5   pioglitazone (ACTOS) 30 MG tablet Take 30 mg by mouth daily.     tadalafil (CIALIS) 5 MG tablet Take 5 mg by mouth daily.     tamsulosin (FLOMAX) 0.4 MG CAPS capsule Take 0.4 mg by mouth daily.     No current facility-administered medications on file prior to visit.    Past Surgical History:  Procedure Laterality Date   COLONOSCOPY     INCISIONAL HERNIA REPAIR     right and left   TONSILLECTOMY      No Known Allergies  BP 132/80   Ht 6\' 1"  (1.854 m)   Wt 175  lb (79.4 kg)   BMI 23.09 kg/m      09/25/2020    2:34 PM  [facility]  Frequency of aerobic exercise (# of days/week) 3  Average time in minutes 60  Frequency of strengthening activities (# of days/week) 3        No data to display              Objective:  Physical Exam:  Gen: NAD, comfortable in exam room  Right knee: No gross deformity, ecchymoses, swelling. No TTP. FROM with normal strength. Negative ant/post drawers. Negative valgus/varus testing. Negative lachman. Negative mcmurrays, apleys.  NV intact distally.   Assessment & Plan:  1. Right knee pain - improved from his IT band and patellofemoral syndromes.  Icing if needed.  Home exercises/stretches.  Tylenol if needed.  Follow up as needed.

## 2024-02-09 DIAGNOSIS — L7211 Pilar cyst: Secondary | ICD-10-CM | POA: Diagnosis not present

## 2024-03-29 DIAGNOSIS — Z0000 Encounter for general adult medical examination without abnormal findings: Secondary | ICD-10-CM | POA: Diagnosis not present

## 2024-03-29 DIAGNOSIS — E1169 Type 2 diabetes mellitus with other specified complication: Secondary | ICD-10-CM | POA: Diagnosis not present

## 2024-03-29 DIAGNOSIS — E7800 Pure hypercholesterolemia, unspecified: Secondary | ICD-10-CM | POA: Diagnosis not present

## 2024-03-29 DIAGNOSIS — E785 Hyperlipidemia, unspecified: Secondary | ICD-10-CM | POA: Diagnosis not present

## 2024-03-29 DIAGNOSIS — G720 Drug-induced myopathy: Secondary | ICD-10-CM | POA: Diagnosis not present

## 2024-04-13 DIAGNOSIS — E1169 Type 2 diabetes mellitus with other specified complication: Secondary | ICD-10-CM | POA: Diagnosis not present

## 2024-04-13 DIAGNOSIS — E7800 Pure hypercholesterolemia, unspecified: Secondary | ICD-10-CM | POA: Diagnosis not present

## 2024-04-13 DIAGNOSIS — E785 Hyperlipidemia, unspecified: Secondary | ICD-10-CM | POA: Diagnosis not present

## 2024-04-13 DIAGNOSIS — G720 Drug-induced myopathy: Secondary | ICD-10-CM | POA: Diagnosis not present

## 2024-07-05 ENCOUNTER — Ambulatory Visit (INDEPENDENT_AMBULATORY_CARE_PROVIDER_SITE_OTHER): Admitting: Family Medicine

## 2024-07-05 ENCOUNTER — Ambulatory Visit
Admission: RE | Admit: 2024-07-05 | Discharge: 2024-07-05 | Disposition: A | Discharge status: Home / Self Care | Source: Ambulatory Visit | Attending: Family Medicine | Admitting: Family Medicine

## 2024-07-05 VITALS — BP 126/70 | Ht 73.0 in | Wt 170.0 lb

## 2024-07-05 DIAGNOSIS — M25551 Pain in right hip: Secondary | ICD-10-CM

## 2024-07-05 DIAGNOSIS — M160 Bilateral primary osteoarthritis of hip: Secondary | ICD-10-CM | POA: Diagnosis not present

## 2024-07-05 DIAGNOSIS — E7800 Pure hypercholesterolemia, unspecified: Secondary | ICD-10-CM | POA: Diagnosis not present

## 2024-07-05 DIAGNOSIS — E1169 Type 2 diabetes mellitus with other specified complication: Secondary | ICD-10-CM | POA: Diagnosis not present

## 2024-07-05 NOTE — Patient Instructions (Signed)
 Your history and exam are reassuring. Get x-rays of your pelvis after you leave today. Take tylenol  500mg  1-2 tabs three times a day for pain. Continue with your workout routine. Add some of these exercises for your glute max. Follow up in 5-6 weeks.

## 2024-07-06 NOTE — Progress Notes (Signed)
 PCP: Street, Lonni HERO, MD  Subjective:   HPI: Patient is a 83 y.o. male here for right hip pain.  Patient reports he's had posterior right hip pain for several weeks. Pain better after working out at gym (goes 3 times a week). He's been doing home exercise program from prior visit. Taking tylenol  every 6 hours. No radiation into leg. No numbness or tingling. Feeling improved currently.  Past Medical History:  Diagnosis Date   Allergic rhinitis    Diabetes mellitus without complication (HCC)    Snores    Wears glasses     Current Outpatient Medications on File Prior to Visit  Medication Sig Dispense Refill   CONTOUR TEST test strip TEST BLOOD SUGAR UP TO QID AS DIRECTED     famotidine  (PEPCID ) 40 MG tablet Take 1 tablet (40 mg total) by mouth daily. 30 tablet 5   metFORMIN (GLUCOPHAGE) 1000 MG tablet Take 1 tablet by mouth 2 (two) times daily.     mupirocin  ointment (BACTROBAN ) 2 % saline followed by Bactroban  ointment 3 times a day as needed 30 g 2   Olopatadine-Mometasone (RYALTRIS ) 665-25 MCG/ACT SUSP 2 sprays each nostril 1-2 times per day 29 g 5   pioglitazone (ACTOS) 30 MG tablet Take 30 mg by mouth daily.     tadalafil (CIALIS) 5 MG tablet Take 5 mg by mouth daily.     tamsulosin (FLOMAX) 0.4 MG CAPS capsule Take 0.4 mg by mouth daily.     No current facility-administered medications on file prior to visit.    Past Surgical History:  Procedure Laterality Date   COLONOSCOPY     INCISIONAL HERNIA REPAIR     right and left   TONSILLECTOMY      No Known Allergies  BP 126/70   Ht 6' 1 (1.854 m)   Wt 170 lb (77.1 kg)   BMI 22.43 kg/m      09/25/2020    2:34 PM  [facility]  Frequency of aerobic exercise (# of days/week) 3  Average time in minutes 60  Frequency of strengthening activities (# of days/week) 3        No data to display              Objective:  Physical Exam:  Gen: NAD, comfortable in exam  room  Back/right hip: No gross deformity, scoliosis. TTP just inferior to right iliac crest posteriorly.  No midline or bony TTP. FROM. Strength LEs 5/5 all muscle groups.   Negative SLRs. Sensation intact to light touch bilaterally. Negative logroll, faber, fadir.   Assessment & Plan:  1. Right hip pain - within gluteus maximus musculature.  Will obtain x-rays to ensure nothing abnormal seen in pelvis on this side.  Tylenol , continue exercising.  Additional rehab for gluteus maximus provided today.  Plan to follow up in 5-6 weeks.

## 2024-07-07 DIAGNOSIS — E1169 Type 2 diabetes mellitus with other specified complication: Secondary | ICD-10-CM | POA: Diagnosis not present

## 2024-07-07 DIAGNOSIS — N401 Enlarged prostate with lower urinary tract symptoms: Secondary | ICD-10-CM | POA: Diagnosis not present

## 2024-07-07 DIAGNOSIS — Z23 Encounter for immunization: Secondary | ICD-10-CM | POA: Diagnosis not present

## 2024-07-07 DIAGNOSIS — N138 Other obstructive and reflux uropathy: Secondary | ICD-10-CM | POA: Diagnosis not present

## 2024-07-07 DIAGNOSIS — E7800 Pure hypercholesterolemia, unspecified: Secondary | ICD-10-CM | POA: Diagnosis not present

## 2024-07-12 ENCOUNTER — Ambulatory Visit: Payer: Self-pay | Admitting: Family Medicine

## 2024-07-13 DIAGNOSIS — R351 Nocturia: Secondary | ICD-10-CM | POA: Diagnosis not present

## 2024-07-13 DIAGNOSIS — N401 Enlarged prostate with lower urinary tract symptoms: Secondary | ICD-10-CM | POA: Diagnosis not present

## 2024-07-15 ENCOUNTER — Other Ambulatory Visit: Payer: Self-pay | Admitting: Urology

## 2024-07-15 DIAGNOSIS — N401 Enlarged prostate with lower urinary tract symptoms: Secondary | ICD-10-CM

## 2024-07-21 ENCOUNTER — Ambulatory Visit
Admission: RE | Admit: 2024-07-21 | Discharge: 2024-07-21 | Disposition: A | Discharge status: Home / Self Care | Source: Ambulatory Visit | Attending: Urology | Admitting: Urology

## 2024-07-21 DIAGNOSIS — N401 Enlarged prostate with lower urinary tract symptoms: Secondary | ICD-10-CM | POA: Diagnosis not present

## 2024-07-21 HISTORY — PX: IR RADIOLOGIST EVAL & MGMT: IMG5224

## 2024-07-23 NOTE — Consult Note (Signed)
 Chief Complaint: Consult for prostate artery embolization in patient with LUTS secondary to BPH  Referring Provider(s): Chao,Roberto   Patient Status: [facility] outpatient  History of Present Illness: Aaron May is a 83 y.o. male LUTS with his biggest complaint of increased urinary frequency.  IPSS score 24 indicating severe symptoms (07/15/24) and a QoL of 6.  He states the medications he is currently taking are no longer offering any assistance to his problems with urination.  The procedure, risks, benefits, and alternatives were discussed in detail with the patient. The patient had numerous questions all of which were answered to their satisfaction. The procedure will be performed under moderate sedation in a hospital setting. The patient will likely be discharged postprocedure. Pre and postprocedure expectations discussed in detail. In order to decrease procedure time a pre-procedure CT of the pelvis with contrast will be obtained in order to delineate anatomy and the arterial tree to better evaluate for location of the prostate artery and peripheral arterial disease which may limit access or dictate how the procedure is performed. Embolization in general as well as specific to the prostatic artery discussed in detail with imaging for the patient to better understand.    Past Medical History:  Diagnosis Date   Allergic rhinitis    Diabetes mellitus without complication (HCC)    Snores    Wears glasses     Past Surgical History:  Procedure Laterality Date   COLONOSCOPY     INCISIONAL HERNIA REPAIR     right and left   TONSILLECTOMY      Allergies: Patient has no known allergies.  Medications: Prior to Admission medications   Medication Sig Start Date End Date Taking? Authorizing Provider  CONTOUR TEST test strip TEST BLOOD SUGAR UP TO QID AS DIRECTED 01/12/19   [provider]  famotidine  (PEPCID ) 40 MG tablet Take 1 tablet (40 mg total) by mouth daily. 10/30/22    Kozlow, Eric J, MD  metFORMIN (GLUCOPHAGE) 1000 MG tablet Take 1 tablet by mouth 2 (two) times daily. 03/22/21   [provider]  mupirocin  ointment (BACTROBAN ) 2 % saline followed by Bactroban  ointment 3 times a day as needed 01/09/23   Kozlow, Eric J, MD  Olopatadine-Mometasone (RYALTRIS ) 665-25 MCG/ACT SUSP 2 sprays each nostril 1-2 times per day 10/30/22   Kozlow, Eric J, MD  pioglitazone (ACTOS) 30 MG tablet Take 30 mg by mouth daily. 03/22/21   [provider]  tadalafil (CIALIS) 5 MG tablet Take 5 mg by mouth daily. 12/28/21   [provider]  tamsulosin (FLOMAX) 0.4 MG CAPS capsule Take 0.4 mg by mouth daily. 11/20/22   [provider]     No family history on file.  Social History   Socioeconomic History   Marital status: Married    Spouse name: Not on file   Number of children: Not on file   Years of education: Not on file   Highest education level: Not on file  Occupational History   Not on file  Tobacco Use   Smoking status: Never   Smokeless tobacco: Never  Substance and Sexual Activity   Alcohol use: Yes    Alcohol/week: 0.0 standard drinks of alcohol    Comment: very rare   Drug use: No   Sexual activity: Not on file  Other Topics Concern   Not on file  Social History Narrative   Not on file   Social Drivers of Health   Financial Resource Strain: Not on  file  Food Insecurity: Not on file  Transportation Needs: Not on file  Physical Activity: Not on file  Stress: Not on file  Social Connections: Not on file     Review of Systems: A 12 point ROS discussed and pertinent positives are indicated in the HPI above.  All other systems are negative.  Review of Systems  All other systems reviewed and are negative.   Vital Signs: BP (!) 142/73 (BP Location: Left Arm, Patient Position: Sitting, Cuff Size: Normal)   Pulse 68   Temp 97.8 F (36.6 C) (Oral)   Resp 18   SpO2 98%     Physical Exam Constitutional:       Appearance: Normal appearance.  Cardiovascular:     Rate and Rhythm: Normal rate and regular rhythm.     Heart sounds: Normal heart sounds.  Pulmonary:     Effort: Pulmonary effort is normal.     Breath sounds: Normal breath sounds.  Skin:    General: Skin is warm and dry.  Neurological:     Mental Status: He is alert and oriented to person, place, and time.  Psychiatric:        Mood and Affect: Mood normal.        Behavior: Behavior normal.        Thought Content: Thought content normal.     Imaging: DG Pelvis 1-2 Views Result Date: 07/09/2024 EXAM: 1 or 2 VIEW(S) XRAY OF THE PELVIS 07/05/2024 03:22:08 PM COMPARISON: 11/03/2019 CLINICAL HISTORY: Right hip pain. Worsening right hip pain x 1 week that improves during the day after exercises or advil. NKI. FINDINGS: BONES AND JOINTS: Mild-to-moderate bilateral hip superior joint space narrowing with sclerosis along the superior acetabulum. No acute fracture. No focal osseous lesion. No joint dislocation. SOFT TISSUES: The soft tissues are unremarkable. IMPRESSION: 1. No acute findings. 2. Mild-to-moderate bilateral hip superior joint space narrowing with sclerosis along the superior acetabulum. 3. Lumbar scoliosis and degenerative disc disease Electronically signed by: Waddell Calk MD 07/09/2024 04:32 PM EDT RP Workstation: [id number]    Labs:  CBC: No results for input(s): WBC, HGB, HCT, PLT in the last 8760 hours.  COAGS: No results for input(s): INR, APTT in the last 8760 hours.  BMP: No results for input(s): NA, K, CL, CO2, GLUCOSE, BUN, CALCIUM, CREATININE, GFRNONAA, GFRAA in the last 8760 hours.  Invalid input(s): CMP  LIVER FUNCTION TESTS: No results for input(s): BILITOT, AST, ALT, ALKPHOS, PROT, ALBUMIN in the last 8760 hours.  TUMOR MARKERS: No results for input(s): AFPTM, CEA, CA199, CHROMGRNA in the last 8760 hours.  Assessment and Plan:  LUTS with prostatic  hypertrophy.  Plan for prostatic artery embolization.  Will obtain CTA of the pelvis and review the anatomy prior to scheduling the patient for the procedure.    Electronically Signed: Cordella DELENA Banner, MD   07/23/2024, 8:30 AM     I spent a total of  40 Minutes   in face to face in clinical consultation, greater than 50% of which was counseling/coordinating care for LUTS and prostatic hypertrophy.

## 2024-07-28 ENCOUNTER — Other Ambulatory Visit: Payer: Self-pay

## 2024-07-28 ENCOUNTER — Encounter (HOSPITAL_COMMUNITY): Payer: Self-pay

## 2024-07-28 DIAGNOSIS — N401 Enlarged prostate with lower urinary tract symptoms: Secondary | ICD-10-CM

## 2024-07-28 DIAGNOSIS — Z23 Encounter for immunization: Secondary | ICD-10-CM | POA: Diagnosis not present

## 2024-08-05 ENCOUNTER — Ambulatory Visit
Admission: RE | Admit: 2024-08-05 | Discharge: 2024-08-05 | Disposition: A | Discharge status: Home / Self Care | Source: Ambulatory Visit

## 2024-08-05 DIAGNOSIS — N401 Enlarged prostate with lower urinary tract symptoms: Secondary | ICD-10-CM

## 2024-08-05 MED ORDER — IOPAMIDOL (ISOVUE-370) INJECTION 76%
80.0000 mL | Freq: Once | INTRAVENOUS | Status: AC | PRN
  Administered 2024-08-05: 80 mL via INTRAVENOUS

## 2024-08-09 ENCOUNTER — Other Ambulatory Visit: Payer: Self-pay

## 2024-08-09 DIAGNOSIS — N401 Enlarged prostate with lower urinary tract symptoms: Secondary | ICD-10-CM

## 2024-08-09 NOTE — H&P (Signed)
 Chief Complaint: Patient was seen in consultation today for benign prostatic hyperplasia with lower urinary tract symptoms.  Referring Physician(s): Aaron General, MD  Supervising Physician: Aaron May  Patient Status: [facility] - Out-pt  History of Present Illness: Aaron May is a 83 y.o. male with a past medical history significant for DM, neuropathy, tinnitus, kidney stones, arthrosclerosis, diverticulosis and BPH with LUTS who presents today for prostate artery embolization. Aaron May is followed by [facility] due to complaints of urinary frequency, urgency and nocturia. He has been taking Flomax which initially helped however he has noticed a return of previous symptoms. He was seen by urology and discussed the use of finasteride however his son in law underwent prostate artery embolization a Aaron May previously and he was interested in discussing this option first. He was seen by Dr. Jenna on 08/06/24 and deemed a candidate for PAE for which he presents today.   Past Medical History:  Diagnosis Date   Allergic rhinitis    Diabetes mellitus without complication (HCC)    Snores    Wears glasses     Past Surgical History:  Procedure Laterality Date   COLONOSCOPY     INCISIONAL HERNIA REPAIR     right and left   IR RADIOLOGIST EVAL & MGMT  07/21/2024   TONSILLECTOMY      Allergies: Patient has no known allergies.  Medications: Prior to Admission medications   Medication Sig Start Date End Date Taking? Authorizing Provider  CONTOUR TEST test strip TEST BLOOD SUGAR UP TO QID AS DIRECTED 01/12/19   [provider]  famotidine  (PEPCID ) 40 MG tablet Take 1 tablet (40 mg total) by mouth daily. 10/30/22   Kozlow, Eric J, MD  metFORMIN (GLUCOPHAGE) 1000 MG tablet Take 1 tablet by mouth 2 (two) times daily. 03/22/21   [provider]  mupirocin  ointment (BACTROBAN ) 2 % saline followed by Bactroban  ointment 3 times a day as needed 01/09/23    Kozlow, Eric J, MD  Olopatadine-Mometasone (RYALTRIS ) 665-25 MCG/ACT SUSP 2 sprays each nostril 1-2 times per day 10/30/22   Kozlow, Eric J, MD  pioglitazone (ACTOS) 30 MG tablet Take 30 mg by mouth daily. 03/22/21   [provider]  tadalafil (CIALIS) 5 MG tablet Take 5 mg by mouth daily. 12/28/21   [provider]  tamsulosin (FLOMAX) 0.4 MG CAPS capsule Take 0.4 mg by mouth daily. 11/20/22   [provider]     No family history on file.  Social History   Socioeconomic History   Marital status: Married    Spouse name: Not on file   Number of children: Not on file   Years of education: Not on file   Highest education level: Not on file  Occupational History   Not on file  Tobacco Use   Smoking status: Never   Smokeless tobacco: Never  Substance and Sexual Activity   Alcohol use: Yes    Alcohol/week: 0.0 standard drinks of alcohol    Comment: very rare   Drug use: No   Sexual activity: Not on file  Other Topics Concern   Not on file  Social History Narrative   Not on file   Social Drivers of Health   Financial Resource Strain: Not on file  Food Insecurity: Not on file  Transportation Needs: Not on file  Physical Activity: Not on file  Stress: Not on file  Social Connections: Not on file     Review of Systems: A 12  point ROS discussed and pertinent positives are indicated in the HPI above.  All other systems are negative.  Review of Systems  Vital Signs: There were no vitals taken for this visit.  Physical Exam       Imaging: CT ANGIO PELVIS W OR WO CONTRAST Result Date: 08/07/2024 CLINICAL DATA:  83 year old male with history of benign prostatic hyperplasia with lower urinary tract symptoms. EXAM: CT ANGIOGRAPHY OF PELVIS TECHNIQUE: Multidetector CT imaging of the abdomen and pelvis was performed using the standard protocol during bolus administration of intravenous contrast. Multiplanar reconstructed images and MIPs were obtained and  reviewed to evaluate the vascular anatomy. RADIATION DOSE REDUCTION: This exam was performed according to the departmental dose-optimization program which includes automated exposure control, adjustment of the mA and/or kV according to patient size and/or use of iterative reconstruction technique. CONTRAST:  80mL ISOVUE-370 IOPAMIDOL (ISOVUE-370) INJECTION 76% COMPARISON:  02/04/2019 FINDINGS: VASCULAR Distal aorta: Patent and normal caliber with scattered circumferential atherosclerotic calcifications. Inflow: Patent without evidence of aneurysm, dissection, vasculitis or significant stenosis. There are few scattered atherosclerotic calcifications. Proximal Outflow: Bilateral common femoral and visualized portions of the superficial and profunda femoral arteries are patent without evidence of aneurysm, dissection, vasculitis or significant stenosis. Prostatic arteries: The right prostatic artery is not definitively identified. There is a more proximal tortuous artery that arises from the proximal obturator artery which may represent the vesiculoprostatic artery. The left prostatic artery arises from the distal anterior division at the trifurcation of the operator and shared trunk of the inferior gluteal/internal pudendal. Veins: No obvious venous abnormality within the limitations of this arterial phase study. Review of the MIP images confirms the above findings. NON-VASCULAR Urinary Tract: The bladder is within normal limits. No evidence of hydroureter. Bowel: Appendix appears normal. No evidence of bowel wall thickening, distention, or inflammatory changes. Lymphatic: No pelvic lymphadenopathy. Reproductive: The prostate is enlarged measuring approximately 4.5 x 6.0 x 6.0 cm (AP by trans by cc) with an estimated ellipsoid volume of approximately 85 g. Other: No abdominal wall hernia or abnormality. No abdominopelvic ascites. Musculoskeletal: No acute osseous abnormality. Degenerative changes about the visualized  lumbosacral spine. IMPRESSION: VASCULAR 1. Likely [place] type 3 right prostatic artery with poor visualization distally. 2. [place] type 2 left prostatic artery. 3.  Aortic Atherosclerosis (ICD10-I70.0). NON-VASCULAR Prostatomegaly (85 g). Ester Sides, MD [facility] Electronically Signed   By: Ester Sides M.D.   On: 08/07/2024 07:30   IR Radiologist Eval & Mgmt Result Date: 08/06/2024 EXAM: NEW PATIENT OFFICE VISIT CHIEF COMPLAINT: See below HISTORY OF PRESENT ILLNESS: See below REVIEW OF SYSTEMS: See below PHYSICAL EXAMINATION: See below ASSESSMENT AND PLAN: Please refer to completed note in the electronic medical record on [facility] Cathlyn Banner, MD [facility] Radiology Electronically Signed   By: Cordella Banner   On: 08/06/2024 11:34    Labs:  CBC: No results for input(s): WBC, HGB, HCT, PLT in the last 8760 hours.  COAGS: No results for input(s): INR, APTT in the last 8760 hours.  BMP: No results for input(s): NA, K, CL, CO2, GLUCOSE, BUN, CALCIUM, CREATININE, GFRNONAA, GFRAA in the last 8760 hours.  Invalid input(s): CMP  LIVER FUNCTION TESTS: No results for input(s): BILITOT, AST, ALT, ALKPHOS, PROT, ALBUMIN in the last 8760 hours.  TUMOR MARKERS: No results for input(s): AFPTM, CEA, CA199, CHROMGRNA in the last 8760 hours.  Assessment and Plan:  83 y/o M with history of BPH  with LUTS (urinary frequency, urgency, nocturia) not improved with medications who presents today for prostate artery embolization.  IPSS = 24, QoL = 6  Risks and benefits of pelvic arteriogram with intervention were discussed with the patient including, but not limited to bleeding, infection, vascular injury, contrast induced renal failure, stroke, reperfusion hemorrhage, or even death.  This interventional procedure involves  the use of X-rays and because of the nature of the planned procedure, it is possible that we will have prolonged use of X-ray fluoroscopy.  Potential radiation risks to you include (but are not limited to) the following: - A slightly elevated risk for cancer  several years later in life. This risk is typically less than 0.5% percent. This risk is low in comparison to the normal incidence of human cancer, which is 33% for women and 50% for men according to the American Cancer Society. - Radiation induced injury can include skin redness, resembling a rash, tissue breakdown / ulcers and hair loss (which can be temporary or permanent).  The likelihood of either of these occurring depends on the difficulty of the procedure and whether you are sensitive to radiation due to previous procedures, disease, or genetic conditions.  IF your procedure requires a prolonged use of radiation, you will be notified and given written instructions for further action.  It is your responsibility to monitor the irradiated area for the 2 weeks following the procedure and to notify your physician if you are concerned that you have suffered a radiation induced injury.    All of the patient's questions were answered, patient is agreeable to proceed.  Consent signed and in chart.  Thank you for this interesting consult.  I greatly enjoyed meeting Aaron May and look forward to participating in their care.  A copy of this report was sent to the requesting provider on this date.  Electronically Signed: Clotilda DELENA Hesselbach, PA-C 08/09/2024, 9:32 AM   I spent a total of  30 Minutes  in face to face in clinical consultation, greater than 50% of which was counseling/coordinating care for BPH with LUTS.

## 2024-08-10 ENCOUNTER — Other Ambulatory Visit: Payer: Self-pay

## 2024-08-10 ENCOUNTER — Ambulatory Visit (HOSPITAL_COMMUNITY)
Admission: RE | Admit: 2024-08-10 | Discharge: 2024-08-10 | Disposition: A | Discharge status: Home / Self Care | Source: Ambulatory Visit

## 2024-08-10 ENCOUNTER — Other Ambulatory Visit (HOSPITAL_COMMUNITY): Payer: Self-pay | Admitting: Radiology

## 2024-08-10 DIAGNOSIS — Z79899 Other long term (current) drug therapy: Secondary | ICD-10-CM | POA: Diagnosis not present

## 2024-08-10 DIAGNOSIS — Z7984 Long term (current) use of oral hypoglycemic drugs: Secondary | ICD-10-CM | POA: Diagnosis not present

## 2024-08-10 DIAGNOSIS — N401 Enlarged prostate with lower urinary tract symptoms: Secondary | ICD-10-CM

## 2024-08-10 DIAGNOSIS — E1140 Type 2 diabetes mellitus with diabetic neuropathy, unspecified: Secondary | ICD-10-CM | POA: Diagnosis not present

## 2024-08-10 HISTORY — PX: IR US GUIDE VASC ACCESS LEFT: IMG2389

## 2024-08-10 HISTORY — PX: IR ANGIOGRAM PELVIS SELECTIVE OR SUPRASELECTIVE: IMG661

## 2024-08-10 HISTORY — PX: IR EMBO ARTERIAL NOT HEMORR HEMANG INC GUIDE ROADMAPPING: IMG5448

## 2024-08-10 HISTORY — PX: IR ANGIOGRAM SELECTIVE EACH ADDITIONAL VESSEL: IMG667

## 2024-08-10 HISTORY — PX: IR US GUIDE VASC ACCESS RIGHT: IMG2390

## 2024-08-10 LAB — CBC
HCT: 46.7 % (ref 39.0–52.0)
Hemoglobin: 15.4 g/dL (ref 13.0–17.0)
MCH: 31.2 pg (ref 26.0–34.0)
MCHC: 33 g/dL (ref 30.0–36.0)
MCV: 94.5 fL (ref 80.0–100.0)
Platelets: 225 K/uL (ref 150–400)
RBC: 4.94 MIL/uL (ref 4.22–5.81)
RDW: 13.8 % (ref 11.5–15.5)
WBC: 5.7 K/uL (ref 4.0–10.5)
nRBC: 0 % (ref 0.0–0.2)

## 2024-08-10 LAB — BASIC METABOLIC PANEL WITH GFR
Anion gap: 9 (ref 5–15)
BUN: 20 mg/dL (ref 8–23)
CO2: 27 mmol/L (ref 22–32)
Calcium: 8.9 mg/dL (ref 8.9–10.3)
Chloride: 100 mmol/L (ref 98–111)
Creatinine, Ser: 0.98 mg/dL (ref 0.61–1.24)
GFR, Estimated: >60 mL/min (ref >60)
Glucose, Bld: 196 mg/dL — ABNORMAL HIGH (ref 70–99)
Potassium: 5.4 mmol/L — ABNORMAL HIGH (ref 3.5–5.1)
Sodium: 136 mmol/L (ref 135–145)

## 2024-08-10 LAB — PROTIME-INR
INR: 1 (ref 0.8–1.2)
Prothrombin Time: 13.7 s (ref 11.4–15.2)

## 2024-08-10 LAB — GLUCOSE, CAPILLARY: Glucose-Capillary: 167 mg/dL — ABNORMAL HIGH (ref 70–99)

## 2024-08-10 MED ORDER — PHENAZOPYRIDINE HCL 95 MG PO TABS: 95.0000 mg | ORAL_TABLET | Freq: Three times a day (TID) | ORAL | 0 refills | Status: AC

## 2024-08-10 MED ORDER — LIDOCAINE HCL 1 % IJ SOLN
INTRAMUSCULAR | Status: AC
Start: 2024-08-10 — End: 2024-08-10
  Filled 2024-08-10: qty 20

## 2024-08-10 MED ORDER — SODIUM CHLORIDE 0.9 % IV SOLN: INTRAVENOUS | Status: DC

## 2024-08-10 MED ORDER — FENTANYL CITRATE (PF) 100 MCG/2ML IJ SOLN
INTRAMUSCULAR | Status: AC | PRN
  Administered 2024-08-10: 50 ug via INTRAVENOUS
  Administered 2024-08-10: 25 ug via INTRAVENOUS

## 2024-08-10 MED ORDER — IOHEXOL 300 MG/ML  SOLN
100.0000 mL | Freq: Once | INTRAMUSCULAR | Status: AC | PRN
  Administered 2024-08-10: 50 mL via INTRAVENOUS

## 2024-08-10 MED ORDER — CHLORHEXIDINE GLUCONATE CLOTH 2 % EX PADS
6.0000 | MEDICATED_PAD | Freq: Every day | CUTANEOUS | Status: DC
Start: 2024-08-10 — End: 2024-08-11

## 2024-08-10 MED ORDER — CIPROFLOXACIN HCL 500 MG PO TABS: 500.0000 mg | ORAL_TABLET | Freq: Two times a day (BID) | ORAL | 0 refills | Status: AC

## 2024-08-10 MED ORDER — SULFAMETHOXAZOLE-TRIMETHOPRIM 400-80 MG PO TABS
1.0000 | ORAL_TABLET | Freq: Once | ORAL | Status: AC
  Administered 2024-08-10: 1 via ORAL
  Filled 2024-08-10: qty 1

## 2024-08-10 MED ORDER — IOHEXOL 300 MG/ML  SOLN
100.0000 mL | Freq: Once | INTRAMUSCULAR | Status: AC | PRN
  Administered 2024-08-10: 25 mL via INTRA_ARTERIAL

## 2024-08-10 MED ORDER — IBUPROFEN 200 MG PO TABS: 400.0000 mg | ORAL_TABLET | Freq: Four times a day (QID) | ORAL | 0 refills | Status: AC

## 2024-08-10 MED ORDER — LIDOCAINE HCL 1 % IJ SOLN: 20.0000 mL | Freq: Once | INTRAMUSCULAR | Status: DC

## 2024-08-10 MED ORDER — MIDAZOLAM HCL 2 MG/2ML IJ SOLN
INTRAMUSCULAR | Status: AC | PRN
  Administered 2024-08-10 (×2): 1 mg via INTRAVENOUS

## 2024-08-10 MED ORDER — NITROGLYCERIN 1 MG/10 ML FOR IR/CATH LAB
INTRA_ARTERIAL | Status: AC
  Filled 2024-08-10: qty 10

## 2024-08-10 MED ORDER — MIDAZOLAM HCL 2 MG/2ML IJ SOLN
INTRAMUSCULAR | Status: AC
  Filled 2024-08-10: qty 4

## 2024-08-10 MED ORDER — FENTANYL CITRATE (PF) 100 MCG/2ML IJ SOLN
INTRAMUSCULAR | Status: AC
  Filled 2024-08-10: qty 4

## 2024-08-10 MED ORDER — DEXAMETHASONE SOD PHOSPHATE PF 10 MG/ML IJ SOLN
8.0000 mg | Freq: Once | INTRAMUSCULAR | Status: AC
  Administered 2024-08-10: 8 mg via INTRAVENOUS

## 2024-08-10 MED ORDER — IOHEXOL 300 MG/ML  SOLN
150.0000 mL | Freq: Once | INTRAMUSCULAR | Status: AC | PRN
  Administered 2024-08-10: 100 mL via INTRA_ARTERIAL

## 2024-08-10 MED ORDER — NITROGLYCERIN 1 MG/10 ML FOR IR/CATH LAB
INTRA_ARTERIAL | Status: AC | PRN
  Administered 2024-08-10 (×2): 100 ug via INTRA_ARTERIAL
  Administered 2024-08-10: 80 ug via INTRA_ARTERIAL

## 2024-08-10 MED ORDER — PHENAZOPYRIDINE HCL 95 MG PO TABS
95.0000 mg | ORAL_TABLET | Freq: Three times a day (TID) | ORAL | 0 refills | Status: AC | PRN
Start: 2024-08-10 — End: 2024-08-17

## 2024-08-10 MED ORDER — NAPROXEN 250 MG PO TABS
500.0000 mg | ORAL_TABLET | Freq: Once | ORAL | Status: AC
  Administered 2024-08-10: 500 mg via ORAL
  Filled 2024-08-10: qty 2

## 2024-08-10 MED ORDER — SOLIFENACIN SUCCINATE 5 MG PO TABS: 5.0000 mg | ORAL_TABLET | Freq: Every day | ORAL | 0 refills | Status: AC

## 2024-08-10 NOTE — Sedation Documentation (Signed)
Patient transported to Short Stay 

## 2024-08-10 NOTE — Progress Notes (Signed)
 Discharge instructions reviewed with patient wife Blanch at the bedside. Denies questions or concerns. During procedural short stay, there were no s/s of complications at the incisoon site. PT ambulated in the hallway, was able to void without difficulty in the bathroom. After ambulation incision site was rechecked. No s/s of complications at the incision site. Once pt was dressed incision check again still no s/s of complications at the incision site. PT was escorted from the unit via wheel chair to private vehicle.

## 2024-08-10 NOTE — Discharge Instructions (Signed)
 Femoral Site Care This sheet gives you information about how to care for yourself after your procedure. Your health care provider may also give you more specific instructions. If you have problems or questions, contact your health care provider. What can I expect after the procedure?  After the procedure, it is common to have: Bruising that usually fades within 1-2 weeks. Tenderness at the site. Follow these instructions at home: Wound care Follow instructions from your health care provider about how to take care of your insertion site. Make sure you: Wash your hands with soap and water  before you change your bandage (dressing). If soap and water  are not available, use hand sanitizer. Remove your dressing as told by your health care provider. 24 hours Do not take baths, swim, or use a hot tub until your health care provider approves. You may shower 24-48 hours after the procedure or as told by your health care provider. Gently wash the site with plain soap and water . Pat the area dry with a clean towel. Do not rub the site. This may cause bleeding. Do not apply powder or lotion to the site. Keep the site clean and dry. Check your femoral site every day for signs of infection. Check for: Redness, swelling, or pain. Fluid or blood. Warmth. Pus or a bad smell. Activity For the first 2-3 days after your procedure, or as long as directed: Avoid climbing stairs as much as possible. Do not squat. Do not lift anything that is heavier than 10 lb (4.5 kg), or the limit that you are told, until your health care provider says that it is safe. For 5 days Rest as directed. Avoid sitting for a long time without moving. Get up to take short walks every 1-2 hours. Do not drive for 24 hours if you were given a medicine to help you relax (sedative). General instructions Take over-the-counter and prescription medicines only as told by your health care provider. Keep all follow-up visits as told by your  health care provider. This is important. Contact a health care provider if you have: A fever or chills. You have redness, swelling, or pain around your insertion site. Get help right away if: The catheter insertion area swells very fast. You pass out. You suddenly start to sweat or your skin gets clammy. The catheter insertion area is bleeding, and the bleeding does not stop when you hold steady pressure on the area. The area near or just beyond the catheter insertion site becomes pale, cool, tingly, or numb. These symptoms may represent a serious problem that is an emergency. Do not wait to see if the symptoms will go away. Get medical help right away. Call your local emergency services (911 in the [place] Do not drive yourself to the hospital. Summary After the procedure, it is common to have bruising that usually fades within 1-2 weeks. Check your femoral site every day for signs of infection. Do not lift anything that is heavier than 10 lb (4.5 kg), or the limit that you are told, until your health care provider says that it is safe. This information is not intended to replace advice given to you by your health care provider. Make sure you discuss any questions you have with your health care provider. Document Revised: 10/27/2017 Document Reviewed: 10/27/2017 Elsevier Patient Education  2020 [employer].

## 2024-08-10 NOTE — Sedation Documentation (Signed)
 Bedside report given to RN. Femoral site assessed - Level 0, no hematoma, dressing is clean, dry, and intact. Pulses also assessed bilaterally.

## 2024-08-25 DIAGNOSIS — H2513 Age-related nuclear cataract, bilateral: Secondary | ICD-10-CM | POA: Diagnosis not present

## 2024-08-25 DIAGNOSIS — E119 Type 2 diabetes mellitus without complications: Secondary | ICD-10-CM | POA: Diagnosis not present

## 2024-08-25 DIAGNOSIS — H52203 Unspecified astigmatism, bilateral: Secondary | ICD-10-CM | POA: Diagnosis not present

## 2024-09-28 ENCOUNTER — Inpatient Hospital Stay
Admission: RE | Admit: 2024-09-28 | Discharge: 2024-09-28 | Disposition: A | Source: Ambulatory Visit | Attending: Radiology | Admitting: Radiology

## 2024-09-28 VITALS — BP 152/79 | HR 67 | Temp 97.5°F | Resp 16 | Wt 172.0 lb

## 2024-09-28 DIAGNOSIS — N401 Enlarged prostate with lower urinary tract symptoms: Secondary | ICD-10-CM

## 2024-09-28 NOTE — Progress Notes (Signed)
    Referring Physician(s): Omohundro,Jennifer C    Patient Status:  Aaron May  Chief Complaint:  BPH with LUTS s/p PAE.  Subjective:  Aaron May is an 83yo male who underwent PAE on 08/10/2024.  Her returns today with an IPSS 2.  He has multiple questions regarding the procedure and outcome.  QoL score of 3, but it is difficult to understand where he has dissatisfaction (other than use of foley catheter for the procedure and subsequent hematuria which has resolved).  All of his questions were answered and he would like another follow up to be schedule in Jan.  Allergies: Patient has no known allergies.  Medications: Prior to Admission medications   Medication Sig Start Date End Date Taking? Authorizing Provider  CONTOUR TEST test strip TEST BLOOD SUGAR UP TO QID AS DIRECTED 01/12/19   [provider]  famotidine  (PEPCID ) 40 MG tablet Take 1 tablet (40 mg total) by mouth daily. 10/30/22   Kozlow, Eric J, MD  metFORMIN (GLUCOPHAGE) 1000 MG tablet Take 1 tablet by mouth 2 (two) times daily. 03/22/21   [provider]  mupirocin  ointment (BACTROBAN ) 2 % saline followed by Bactroban  ointment 3 times a day as needed 01/09/23   Kozlow, Eric J, MD  Olopatadine-Mometasone (RYALTRIS ) 665-25 MCG/ACT SUSP 2 sprays each nostril 1-2 times per day 10/30/22   Kozlow, Eric J, MD  pioglitazone (ACTOS) 30 MG tablet Take 30 mg by mouth daily. 03/22/21   [provider]  tadalafil (CIALIS) 5 MG tablet Take 5 mg by mouth daily. 12/28/21   [provider]  tamsulosin (FLOMAX) 0.4 MG CAPS capsule Take 0.4 mg by mouth daily. 11/20/22   [provider]     Vital Signs: BP (!) 152/79 (BP Location: Left Arm, Patient Position: Sitting, Cuff Size: Normal)   Pulse 67   Temp (!) 97.5 F (36.4 C) (Oral)   Resp 16   Wt 78 kg   SpO2 99%   BMI 22.69 kg/m   Physical Exam  Imaging: No results found.  Labs:  CBC: Recent Labs    08/10/24 0630  WBC 5.7  HGB 15.4   HCT 46.7  PLT 225    COAGS: Recent Labs    08/10/24 0630  INR 1.0    BMP: Recent Labs    08/10/24 0630  NA 136  K 5.4*  CL 100  CO2 27  GLUCOSE 196*  BUN 20  CALCIUM 8.9  CREATININE 0.98  GFRNONAA >60    LIVER FUNCTION TESTS: No results for input(s): BILITOT, AST, ALT, ALKPHOS, PROT, ALBUMIN in the last 8760 hours.  Assessment and Plan:  S/p PAE for BPH with LUTS.  Marked improvement brom prior to embolization.  Electronically Signed: Cordella DELENA Banner, MD 09/28/2024, 4:05 PM   I spent a total of 35 Minutes in the office, greater than 50% of which was counseling/coordinating care for [facility] and LUTS.  Follow up to be scheduled at the end of Jan or begining of Feb.

## 2024-09-29 DIAGNOSIS — L812 Freckles: Secondary | ICD-10-CM | POA: Diagnosis not present

## 2024-09-29 DIAGNOSIS — L821 Other seborrheic keratosis: Secondary | ICD-10-CM | POA: Diagnosis not present

## 2024-09-29 DIAGNOSIS — L308 Other specified dermatitis: Secondary | ICD-10-CM | POA: Diagnosis not present

## 2024-09-29 DIAGNOSIS — D225 Melanocytic nevi of trunk: Secondary | ICD-10-CM | POA: Diagnosis not present

## 2024-11-08 ENCOUNTER — Ambulatory Visit: Admitting: Family Medicine

## 2024-11-08 VITALS — BP 138/82 | Ht 73.0 in | Wt 172.0 lb

## 2024-11-08 DIAGNOSIS — S46812S Strain of other muscles, fascia and tendons at shoulder and upper arm level, left arm, sequela: Secondary | ICD-10-CM | POA: Diagnosis not present

## 2024-11-08 NOTE — Patient Instructions (Signed)
 You have a trapezius strain. Start physical therapy and do home exercises on days you don't go to therapy. Heat 15 minutes at a time as needed. Voltaren gel up to 4 times a day as needed can be helpful also. Follow up with me in 6 weeks or as needed if you're doing well.

## 2024-11-08 NOTE — Progress Notes (Signed)
 "  PCP: Street, Aaron HERO, MD  Patient is a 84 y.o. male here for L shoulder pain.  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 - L trapezius/shoulder pain for the last three months - Patient was doing a reverse fly on a machine with 40 lbs, he has done this for years, but noticed a cramping sensation in his L trapezius when he was fully abducted; has been avoiding the exercise since - Has not tried topical agents, massages, or PT for the L trapezius - No difficulty moving his shoulder, reaching for items above his head - Hasn't prevented any daily activities - Takes Advil  (3-4 times a day)  Past Medical History:  Diagnosis Date   Allergic rhinitis    Diabetes mellitus without complication (HCC)    Snores    Wears glasses     Medications Ordered Prior to Encounter[1]  Past Surgical History:  Procedure Laterality Date   COLONOSCOPY     INCISIONAL HERNIA REPAIR     right and left   IR ANGIOGRAM PELVIS SELECTIVE OR SUPRASELECTIVE  08/10/2024   IR ANGIOGRAM SELECTIVE EACH ADDITIONAL VESSEL  08/10/2024   IR ANGIOGRAM SELECTIVE EACH ADDITIONAL VESSEL  08/10/2024   IR EMBO ARTERIAL NOT HEMORR HEMANG INC GUIDE ROADMAPPING  08/10/2024   IR RADIOLOGIST EVAL & MGMT  07/21/2024   IR US  GUIDE VASC ACCESS LEFT  08/10/2024   IR US  GUIDE VASC ACCESS LEFT  08/10/2024   IR US  GUIDE VASC ACCESS RIGHT  08/10/2024   TONSILLECTOMY      Allergies[2]  BP 138/82   Ht 6' 1 (1.854 m)   Wt 172 lb (78 kg)   BMI 22.69 kg/m      09/25/2020    2:34 PM  [facility] Adult Exercise  Frequency of aerobic exercise (# of days/week) 3  Average time in minutes 60  Frequency of strengthening activities (# of days/week) 3        No data to display              Objective:  Physical Exam:  Bilateral Shoulder Exam Gen: NAD, comfortable in exam room Inspection: No visible lesions, edema, erythema, overlying skin changes  Palpation: Tightness  over L cervical spine paraspinal muscles > R.  Mild TTP and tightness noted over the L trapezius > R. ROM: FROM in flexion, tension, abduction of bilateral shoulders. FROM of flexion, extension, and rotation of neck. Mild L trapezius pain with rotation to the R. Special Tests: Negative Neer's, Hawkins, Empty Can, Full Can, and Spurling test bilaterally. Negative Speed's Test on the L. 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  Likely aggravated by performing a reverse fly at the gym.  Patient has tried OTC NSAIDs without much relief.  No rotator cuff abnormalities noted on physical exam.  Tightness/mild tenderness over L trapezius likely secondary to strain, discussed conservative options such as home exercises vs formal PT, patient would like to proceed with both at this time.  - Home exercises for the left trapezius reviewed and provided - Referral to PT provided for left trapezius - Recommended OTC Voltaren gel, NSAIDs, and Tylenol  as needed - Follow-up in 6 weeks as needed  Kathrine Melena, DO [facility]     [1]  Current Outpatient Medications on File Prior to Visit  Medication Sig Dispense Refill   CONTOUR TEST test strip TEST BLOOD SUGAR UP TO QID AS DIRECTED     famotidine  (PEPCID ) 40 MG tablet Take 1 tablet (40 mg total) by mouth daily. 30 tablet 5   metFORMIN (GLUCOPHAGE) 1000 MG tablet Take 1 tablet by mouth 2 (two) times daily.     mupirocin  ointment (BACTROBAN ) 2 % saline followed by Bactroban  ointment 3 times a day as needed 30 g 2   Olopatadine-Mometasone (RYALTRIS ) 665-25 MCG/ACT SUSP 2 sprays each nostril 1-2 times per day 29 g 5   pioglitazone (ACTOS) 30 MG tablet Take 30 mg by mouth daily.     tadalafil (CIALIS) 5 MG tablet Take 5 mg by mouth daily.     tamsulosin (FLOMAX) 0.4 MG CAPS capsule Take 0.4 mg by mouth daily.      No current facility-administered medications on file prior to visit.  [2] No Known Allergies  "

## 2024-12-02 ENCOUNTER — Ambulatory Visit: Payer: Medicare Other | Admitting: Allergy and Immunology
